# Patient Record
Sex: Female | Born: 1945
Health system: Southern US, Community
[De-identification: ages and names within clinical notes are randomized; demographics above are authoritative.]

## PROBLEM LIST (undated history)

## (undated) DIAGNOSIS — R32 Unspecified urinary incontinence: Secondary | ICD-10-CM

## (undated) DIAGNOSIS — F32A Depression, unspecified: Secondary | ICD-10-CM

## (undated) DIAGNOSIS — K602 Anal fissure, unspecified: Secondary | ICD-10-CM

## (undated) DIAGNOSIS — T7840XA Allergy, unspecified, initial encounter: Secondary | ICD-10-CM

## (undated) DIAGNOSIS — Z86718 Personal history of other venous thrombosis and embolism: Secondary | ICD-10-CM

## (undated) DIAGNOSIS — E119 Type 2 diabetes mellitus without complications: Secondary | ICD-10-CM

## (undated) DIAGNOSIS — I10 Essential (primary) hypertension: Secondary | ICD-10-CM

## (undated) DIAGNOSIS — H409 Unspecified glaucoma: Secondary | ICD-10-CM

## (undated) HISTORY — DX: Personal history of other venous thrombosis and embolism: Z86.718

## (undated) HISTORY — DX: Type 2 diabetes mellitus without complications: E11.9

## (undated) HISTORY — DX: Essential (primary) hypertension: I10

## (undated) HISTORY — PX: ABDOMINAL HYSTERECTOMY: SHX81

## (undated) HISTORY — PX: ROTATOR CUFF REPAIR: SHX139

## (undated) HISTORY — PX: CHOLECYSTECTOMY: SHX55

## (undated) HISTORY — DX: Unspecified urinary incontinence: R32

## (undated) HISTORY — DX: Depression, unspecified: F32.A

## (undated) HISTORY — PX: COLONOSCOPY: SHX174

## (undated) HISTORY — PX: ESOPHAGOGASTRODUODENOSCOPY: SHX1529

## (undated) HISTORY — DX: Allergy, unspecified, initial encounter: T78.40XA

## (undated) HISTORY — DX: Unspecified glaucoma: H40.9

## (undated) HISTORY — DX: Anal fissure, unspecified: K60.2

---

## 1979-07-04 HISTORY — PX: BREAST BIOPSY: SHX20

## 2003-07-04 HISTORY — PX: WRIST SURGERY: SHX841

## 2003-07-04 HISTORY — PX: ANKLE FRACTURE SURGERY: SHX122

## 2019-12-15 HISTORY — PX: ROTATOR CUFF REPAIR: SHX139

## 2021-02-10 LAB — COMPREHENSIVE METABOLIC PANEL
Albumin: 4.1 (ref 3.5–5.0)
Calcium: 9.6 (ref 8.7–10.7)
Globulin: 3

## 2021-02-10 LAB — CBC AND DIFFERENTIAL
HCT: 35 — AB (ref 36–46)
Hemoglobin: 11.4 — AB (ref 12.0–16.0)
Platelets: 379 (ref 150–399)
WBC: 8

## 2021-02-10 LAB — BASIC METABOLIC PANEL
BUN: 25 — AB (ref 4–21)
CO2: 24 — AB (ref 13–22)
Chloride: 99 (ref 99–108)
Creatinine: 1 (ref 0.5–1.1)
Glucose: 86
Potassium: 4.2 (ref 3.4–5.3)
Sodium: 134 — AB (ref 137–147)

## 2021-02-10 LAB — CBC: RBC: 3.77 — AB (ref 3.87–5.11)

## 2021-02-10 LAB — HEPATIC FUNCTION PANEL
ALT: 43 — AB (ref 7–35)
AST: 17 (ref 13–35)
Alkaline Phosphatase: 140 — AB (ref 25–125)
Bilirubin, Total: 0.7

## 2021-03-24 LAB — HEPATIC FUNCTION PANEL
ALT: 14 (ref 7–35)
AST: 11 — AB (ref 13–35)
Alkaline Phosphatase: 66 (ref 25–125)
Bilirubin, Total: 0.4

## 2021-03-24 LAB — BASIC METABOLIC PANEL
BUN: 17 (ref 4–21)
CO2: 23 — AB (ref 13–22)
Chloride: 102 (ref 99–108)
Creatinine: 0.7 (ref 0.5–1.1)
Glucose: 89
Potassium: 4.5 (ref 3.4–5.3)
Sodium: 139 (ref 137–147)

## 2021-03-24 LAB — RHEUMATOID FACTOR: Rheumatoid Factor (IgA): <10

## 2021-03-24 LAB — QUANTIFERON-TB GOLD PLUS: QUANTIFERON TB GOLD: NEGATIVE

## 2021-03-24 LAB — CBC AND DIFFERENTIAL
HCT: 36 (ref 36–46)
Hemoglobin: 11.4 — AB (ref 12.0–16.0)
Platelets: 396 (ref 150–399)
WBC: 5.6

## 2021-03-24 LAB — COMPREHENSIVE METABOLIC PANEL
Albumin: 4.2 (ref 3.5–5.0)
Calcium: 9.2 (ref 8.7–10.7)
GFR calc non Af Amer: 84
Globulin: 2.5

## 2021-03-24 LAB — POCT ERYTHROCYTE SEDIMENTATION RATE, NON-AUTOMATED: Sed Rate: 12

## 2021-03-24 LAB — ANA W/REFLEX: ANA Direct: NEGATIVE

## 2021-03-24 LAB — CBC: RBC: 3.86 — AB (ref 3.87–5.11)

## 2021-03-24 LAB — URIC ACID: Uric Acid: 3.2

## 2021-03-24 LAB — HM HEPATITIS C SCREENING LAB: HM Hepatitis Screen: NEGATIVE

## 2021-04-28 LAB — HM DIABETES EYE EXAM

## 2021-07-08 ENCOUNTER — Encounter: Payer: Self-pay | Admitting: Nurse Practitioner

## 2021-07-08 ENCOUNTER — Other Ambulatory Visit: Payer: Self-pay

## 2021-07-08 ENCOUNTER — Ambulatory Visit (INDEPENDENT_AMBULATORY_CARE_PROVIDER_SITE_OTHER): Payer: Medicare HMO | Admitting: Nurse Practitioner

## 2021-07-08 VITALS — BP 118/66 | HR 73 | Temp 97.6°F | Resp 14 | Ht 68.0 in | Wt 160.4 lb

## 2021-07-08 DIAGNOSIS — E039 Hypothyroidism, unspecified: Secondary | ICD-10-CM | POA: Diagnosis not present

## 2021-07-08 DIAGNOSIS — L858 Other specified epidermal thickening: Secondary | ICD-10-CM

## 2021-07-08 DIAGNOSIS — F341 Dysthymic disorder: Secondary | ICD-10-CM

## 2021-07-08 DIAGNOSIS — G8929 Other chronic pain: Secondary | ICD-10-CM

## 2021-07-08 DIAGNOSIS — M25561 Pain in right knee: Secondary | ICD-10-CM | POA: Diagnosis not present

## 2021-07-08 DIAGNOSIS — R197 Diarrhea, unspecified: Secondary | ICD-10-CM | POA: Diagnosis not present

## 2021-07-08 DIAGNOSIS — L309 Dermatitis, unspecified: Secondary | ICD-10-CM | POA: Diagnosis not present

## 2021-07-08 DIAGNOSIS — I10 Essential (primary) hypertension: Secondary | ICD-10-CM | POA: Diagnosis not present

## 2021-07-08 DIAGNOSIS — E118 Type 2 diabetes mellitus with unspecified complications: Secondary | ICD-10-CM | POA: Diagnosis not present

## 2021-07-08 LAB — COMPREHENSIVE METABOLIC PANEL
ALT: 10 U/L (ref 0–35)
AST: 12 U/L (ref 0–37)
Albumin: 4 g/dL (ref 3.5–5.2)
Alkaline Phosphatase: 46 U/L (ref 39–117)
BUN: 17 mg/dL (ref 6–23)
CO2: 27 mEq/L (ref 19–32)
Calcium: 9.2 mg/dL (ref 8.4–10.5)
Chloride: 103 mEq/L (ref 96–112)
Creatinine, Ser: 0.76 mg/dL (ref 0.40–1.20)
GFR: 76.31 mL/min (ref >60.00)
Glucose, Bld: 85 mg/dL (ref 70–99)
Potassium: 4.4 mEq/L (ref 3.5–5.1)
Sodium: 139 mEq/L (ref 135–145)
Total Bilirubin: 0.4 mg/dL (ref 0.2–1.2)
Total Protein: 6.6 g/dL (ref 6.0–8.3)

## 2021-07-08 LAB — CBC
HCT: 32.7 % — ABNORMAL LOW (ref 36.0–46.0)
Hemoglobin: 10.8 g/dL — ABNORMAL LOW (ref 12.0–15.0)
MCHC: 33 g/dL (ref 30.0–36.0)
MCV: 92 fl (ref 78.0–100.0)
Platelets: 323 10*3/uL (ref 150.0–400.0)
RBC: 3.55 Mil/uL — ABNORMAL LOW (ref 3.87–5.11)
RDW: 13.3 % (ref 11.5–15.5)
WBC: 7 10*3/uL (ref 4.0–10.5)

## 2021-07-08 LAB — POCT GLYCOSYLATED HEMOGLOBIN (HGB A1C): Hemoglobin A1C: 5.8 % — AB (ref 4.0–5.6)

## 2021-07-08 LAB — TSH: TSH: 2.34 u[IU]/mL (ref 0.35–5.50)

## 2021-07-08 NOTE — Assessment & Plan Note (Signed)
Seems to be cutaneous horn to right posterior distal lateral forearm.  Does have a history of skin cancer we will refer her to dermatology for further evaluation.

## 2021-07-08 NOTE — Assessment & Plan Note (Signed)
History of eczema that is being controlled with Dupixent.  Continue Dupixent as prescribed

## 2021-07-08 NOTE — Assessment & Plan Note (Signed)
Patient is currently maintained on metformin 3000 mg total daily.  POC A1c in office 5.8%.  Did discuss with patient and recommended top dose of metformin was 2000 mg daily she acknowledged.  We will change her to metformin 1000 mg twice daily.

## 2021-07-08 NOTE — Assessment & Plan Note (Signed)
Patient is maintained on citalopram 40 mg daily.  PHQ-9 and GAD-7 administered in office.  No HI/SI/AVH.  Continue citalopram 40 mg

## 2021-07-08 NOTE — Progress Notes (Signed)
New Patient Office Visit  Subjective:  Patient ID: Deanna Ramirez, female    DOB: [birth year]  Age: 76 y.o. MRN: [id number]  CC:  Chief Complaint  Patient presents with   Establish Care    See scanned page with previous providers patient seen in [place] and [place]   Encopresis    X 1 year. Daily issue with bowels, loosing control. Did not get to complete Endo and Colonoscopy done before leaving [place]    Skin Problem    Has a few white spots on her body and some were removed before and were pre cancerous.   Discuss possibility of having EPI    HPI Deanna Ramirez presents for Establish  DM: Does not take blood sugar test. If she feels weird she will check it. Metformin 100mg  tab 2 in the am and 1 pm. Didi verify that she has been taking 3,000mg  of metformin daily for approx 10 years poer patient report.  Depression: citalopram 40mg  daily PHQ 9 administered. No HI/Si/AVH  Hypothyroid: levothyroxine in the am.   HTN: Does not check bp at home. On losartan and amlodipine.  Excema: Dupixent 1 shot every 2 weeks  Had a dermatologist for hx of skin cancer.   Was set up with GI doctor due to having recent diarrhea with fecal incontinence at times  Has been seen in the past by urology for leaking. States she was told that if she wanted to have injections in the urethra that would help. She declined that offer.  Also set up with Ortho in the past due to joint effusions of the left knee and "almost bone on bone". She would like to avoid a joint replacement for as long as she can.  Dr Wynetta Emery allergist and does the dupixent  Past Medical History:  Diagnosis Date   Allergy    Depression    Diabetes mellitus without complication ([place])    Glaucoma    Hypertension    Urine incontinence       Family History  Problem Relation Age of Onset   Diabetes Mother    Depression Mother    Asthma Mother    Skin cancer Mother    Hearing loss Father    Diabetes Father    Hyperlipidemia  Brother    Diabetes Brother    Asthma Brother    Dementia Brother    Heart attack Brother    Depression Daughter    COPD Daughter    Asthma Daughter    Depression Daughter    Depression Son    Arthritis Son     Social History   Socioeconomic History   Marital status: Single    Spouse name: Not on file   Number of children: Not on file   Years of education: Not on file   Highest education level: Not on file  Occupational History   Not on file  Tobacco Use   Smoking status: Former    Packs/day: 2.00    Years: 35.00    Pack years: 70.00    Types: Cigarettes    Quit date: 1997    Years since quitting: 26.0   Smokeless tobacco: Never  Vaping Use   Vaping Use: Never used  Substance and Sexual Activity   Alcohol use: Yes    Comment: maybe twice a month   Drug use: Not Currently   Sexual activity: Not Currently  Other Topics Concern   Not on file  Social History Narrative   Not on file  Social Determinants of Health   Financial Resource Strain: Not on file  Food Insecurity: Not on file  Transportation Needs: Not on file  Physical Activity: Not on file  Stress: Not on file  Social Connections: Not on file  Intimate Partner Violence: Not on file    ROS Review of Systems  Constitutional:  Positive for fatigue. Negative for chills and fever.  Respiratory:  Negative for cough and shortness of breath.   Cardiovascular:  Negative for chest pain and leg swelling.  Gastrointestinal:  Positive for diarrhea. Negative for abdominal pain, blood in stool, nausea and vomiting.  Musculoskeletal:  Positive for arthralgias and joint swelling.  Neurological:  Positive for dizziness and numbness (rihgt). Negative for light-headedness and headaches.  Psychiatric/Behavioral:  Negative for hallucinations and sleep disturbance.    PHQ9 SCORE ONLY 07/08/2021  PHQ-9 Total Score 7    GAD 7 : Generalized Anxiety Score 07/08/2021  Nervous, Anxious, on Edge 0  Control/stop worrying 1   Worry too much - different things 1  Trouble relaxing 0  Restless 0  Easily annoyed or irritable 0  Afraid - awful might happen 0  Total GAD 7 Score 2  Anxiety Difficulty Not difficult at all      Objective:   Today's Vitals: BP 118/66    Pulse 73    Temp 97.6 F (36.4 C)    Resp 14    Ht 5\' 8"  (1.727 m)    Wt 160 lb 6 oz (72.7 kg)    SpO2 96%    BMI 24.38 kg/m   Physical Exam Vitals and nursing note reviewed.  Constitutional:      Appearance: Normal appearance.  HENT:     Right Ear: Tympanic membrane, ear canal and external ear normal.     Left Ear: Tympanic membrane, ear canal and external ear normal.     Mouth/Throat:     Mouth: Mucous membranes are moist.     Pharynx: Oropharynx is clear.  Eyes:     Extraocular Movements: Extraocular movements intact.     Pupils: Pupils are equal, round, and reactive to light.  Neck:     Thyroid: No thyroid mass, thyromegaly or thyroid tenderness.  Cardiovascular:     Rate and Rhythm: Normal rate and regular rhythm.     Pulses: Normal pulses.          Dorsalis pedis pulses are 2+ on the right side and 2+ on the left side.     Heart sounds: Normal heart sounds.  Pulmonary:     Effort: Pulmonary effort is normal.     Breath sounds: Normal breath sounds.  Abdominal:     General: Bowel sounds are normal. There is no distension.     Tenderness: There is no abdominal tenderness.  Musculoskeletal:     Comments: Limited abduction to left shoulder from previous shoulder surgeries.  Left wrist does not have flexion or extension due to having surgery.  She is missing her second toe on the right foot.   Feet:     Right foot:     Skin integrity: Dry skin present.     Left foot:     Skin integrity: Dry skin present.     Comments: Decreased sensation on the fore foot of right foot plantar surface.  Lymphadenopathy:     Cervical: No cervical adenopathy.  Skin:    General: Skin is warm and dry.  Neurological:     Mental Status: She is  alert.  Deep Tendon Reflexes:     Reflex Scores:      Bicep reflexes are 2+ on the right side and 2+ on the left side.      Patellar reflexes are 2+ on the right side and 2+ on the left side.    Comments: Bilateral upper and lower extremity strength 5/5.  Psychiatric:        Mood and Affect: Mood normal.        Behavior: Behavior normal.        Thought Content: Thought content normal.        Judgment: Judgment normal.    Assessment & Plan:   Problem List Items Addressed This Visit       Cardiovascular and Mediastinum   Primary hypertension    Patient currently maintained on losartan and amlodipine.  Does not check blood pressure at home.  We will check labs today in office pending results continue taking amlodipine as prescribed.      Relevant Medications   losartan (COZAAR) 100 MG tablet   amLODipine (NORVASC) 5 MG tablet   Other Relevant Orders   CBC (Completed)   Comprehensive metabolic panel (Completed)     Endocrine   Hypothyroidism   Relevant Medications   levothyroxine (SYNTHROID) 75 MCG tablet   Other Relevant Orders   TSH (Completed)   Controlled type 2 diabetes mellitus with complication, without long-term current use of insulin (HCC) - Primary   Relevant Medications   metFORMIN (GLUCOPHAGE) 1000 MG tablet   losartan (COZAAR) 100 MG tablet   Other Relevant Orders   POCT glycosylated hemoglobin (Hb A1C) (Completed)     Musculoskeletal and Integument   Cutaneous horn   Relevant Orders   Ambulatory referral to Dermatology   Eczema     Other   Diarrhea   Relevant Orders   Ambulatory referral to Gastroenterology   Chronic pain of right knee   Relevant Orders   Ambulatory referral to Orthopedic Surgery   Persistent depressive disorder   Relevant Medications   citalopram (CELEXA) 40 MG tablet    Outpatient Encounter Medications as of 07/08/2021  Medication Sig   amLODipine (NORVASC) 5 MG tablet Take 5 mg by mouth daily.   Apoaequorin (PREVAGEN PO)  Take by mouth. 1 in the morning   Cholecalciferol (VITAMIN D3) 50 MCG (2000 UT) TABS Take by mouth. 1 every other day   citalopram (CELEXA) 40 MG tablet Take 40 mg by mouth daily.   Cyanocobalamin (VITAMIN B-12) 5000 MCG TBDP Take by mouth. 1 daily   diphenhydrAMINE-APAP, sleep, (TYLENOL PM EXTRA STRENGTH PO) Take by mouth. 1 at bedtime   Dupilumab (DUPIXENT [place]) Inject into the skin. 1 shot every 14 days   Ferrous Sulfate (IRON) 325 (65 Fe) MG TABS Take by mouth. 1 every other day   gentamicin (GARAMYCIN) 0.3 % ophthalmic solution gentamicin 0.3 % eye drops   latanoprost (XALATAN) 0.005 % ophthalmic solution SMARTSIG:In Eye(s)   levothyroxine (SYNTHROID) 75 MCG tablet Take 75 mcg by mouth daily before breakfast.   losartan (COZAAR) 100 MG tablet Take 100 mg by mouth daily.   melatonin 5 MG TABS Take 5 mg by mouth at bedtime.   metFORMIN (GLUCOPHAGE) 1000 MG tablet Take 1 tablet by mouth 2 (two) times daily.   Multiple Vitamin (MULTIVITAMIN) tablet Take 1 tablet by mouth daily.   Pancrelipase, Lip-Prot-Amyl, (ZENPEP) 5000-24000 units CPEP Take by mouth. 4 tablets in the evening   timolol (TIMOPTIC) 0.5 % ophthalmic solution SMARTSIG:In Eye(s)  No facility-administered encounter medications on file as of 07/08/2021.    Follow-up: Return in about 4 months (around 11/05/2021) for recheck.  This visit occurred during the SARS-CoV-2 public health emergency.  Safety protocols were in place, including screening questions prior to the visit, additional usage of staff PPE, and extensive cleaning of exam room while observing appropriate contact time as indicated for disinfecting solutions.    Romilda Garret, NP

## 2021-07-08 NOTE — Patient Instructions (Signed)
Nice to see you today A1C was 5.8 mg today I am going to change your metformin to 1000mg  once in the morning and 1000mg  once in the evening.  I will be in touch with your lab work.  I will see you in approx 4 months, sooner if you need me

## 2021-07-08 NOTE — Assessment & Plan Note (Signed)
History of chronic right knee pain and joint effusion.  Has had fluid lifting many times per patient.  Was established with orthopedist like to reestablish.  Referral for orthopedist placed

## 2021-07-08 NOTE — Assessment & Plan Note (Signed)
Having persistent diarrhea was hospitalized back in August when she was in [place].  They did put her on Zenpep to help with the digestive enzymes.  States this is helped some but still having was can undergo colonoscopy but moved back to [place].  We will refer her to GI for further evaluation pending labs.

## 2021-07-08 NOTE — Assessment & Plan Note (Signed)
Maintained on levothyroxine 75 mcg daily.  We will check TSH in office today continue levothyroxine 75 mcg as prescribed

## 2021-07-08 NOTE — Assessment & Plan Note (Signed)
Patient currently maintained on losartan and amlodipine.  Does not check blood pressure at home.  We will check labs today in office pending results continue taking amlodipine as prescribed.

## 2021-07-12 ENCOUNTER — Other Ambulatory Visit (HOSPITAL_BASED_OUTPATIENT_CLINIC_OR_DEPARTMENT_OTHER): Payer: Self-pay | Admitting: Orthopaedic Surgery

## 2021-07-12 ENCOUNTER — Ambulatory Visit (HOSPITAL_BASED_OUTPATIENT_CLINIC_OR_DEPARTMENT_OTHER)
Admission: RE | Admit: 2021-07-12 | Discharge: 2021-07-12 | Disposition: A | Discharge status: Home / Self Care | Payer: Medicare HMO | Source: Ambulatory Visit | Attending: Orthopaedic Surgery | Admitting: Orthopaedic Surgery

## 2021-07-12 ENCOUNTER — Ambulatory Visit (HOSPITAL_BASED_OUTPATIENT_CLINIC_OR_DEPARTMENT_OTHER): Payer: Medicare HMO | Admitting: Orthopaedic Surgery

## 2021-07-12 ENCOUNTER — Other Ambulatory Visit: Payer: Self-pay

## 2021-07-12 DIAGNOSIS — G8929 Other chronic pain: Secondary | ICD-10-CM

## 2021-07-12 DIAGNOSIS — M1711 Unilateral primary osteoarthritis, right knee: Secondary | ICD-10-CM | POA: Diagnosis not present

## 2021-07-12 DIAGNOSIS — M25561 Pain in right knee: Secondary | ICD-10-CM | POA: Insufficient documentation

## 2021-07-12 NOTE — Progress Notes (Signed)
Chief Complaint: Right knee pain     History of Present Illness:    Deanna Ramirez is a 76 y.o. female presents with right knee pain that has been going on for multiple years.  She does note that she has significant tricompartmental osteoarthritis.  She has been avoiding knee arthroplasty as she has a history of multiple procedures that resulted in infection including a left tibia fracture repair as well as a left shoulder arthroscopy.  Both times these required subsequent procedures as result she has had trepidation about having the surgery for the right knee.  She does have a history of right knee ACL reconstruction done in 2005 on this knee.  Since that time she has had pain and feels like it gives out.  She states that periodically the knee will become very swollen at which time it is historically been aspirated and injected with steroids.  This does help significantly for up to 6 months.  This was last done in June 2022 in [place] where she is from.  She is recently moved to [place] to be near her daughter.    Surgical History:   Right knee ACL reconstruction 2005  PMH/PSH/Family History/Social History/Meds/Allergies:    Past Medical History:  Diagnosis Date   Allergy    Depression    Diabetes mellitus without complication ([place])    Glaucoma    Hypertension    Urine incontinence     Social History   Socioeconomic History   Marital status: Single    Spouse name: Not on file   Number of children: Not on file   Years of education: Not on file   Highest education level: Not on file  Occupational History   Not on file  Tobacco Use   Smoking status: Former    Packs/day: 2.00    Years: 35.00    Pack years: 70.00    Types: Cigarettes    Quit date: 43    Years since quitting: 26.0   Smokeless tobacco: Never  Vaping Use   Vaping Use: Never used  Substance and Sexual Activity   Alcohol use: Yes    Comment: maybe twice a month   Drug  use: Not Currently   Sexual activity: Not Currently  Other Topics Concern   Not on file  Social History Narrative   Not on file   Social Determinants of Health   Financial Resource Strain: Not on file  Food Insecurity: Not on file  Transportation Needs: Not on file  Physical Activity: Not on file  Stress: Not on file  Social Connections: Not on file   Family History  Problem Relation Age of Onset   Diabetes Mother    Depression Mother    Asthma Mother    Skin cancer Mother    Hearing loss Father    Diabetes Father    Hyperlipidemia Brother    Diabetes Brother    Asthma Brother    Dementia Brother    Heart attack Brother    Depression Daughter    COPD Daughter    Asthma Daughter    Depression Daughter    Depression Son    Arthritis Son    Allergies  Allergen Reactions   Balsam Itching    BALSAM OF [place]   Clobetasone Itching   Hydrocortisone Butyrate  Latex     itching   Neomycin Sulfate [Neomycin] Itching   Nickel Itching   Other     HYDROCORTISONE AND TIXOCORTOL TYPE-itching   Potassium Dichromate Itching    CHROMIUM   Thimerosal Itching    Reacted after using these drops, ended up in ICU   Thiourea Itching   Wool Alcohol [Lanolin] Itching   Current Outpatient Medications  Medication Sig Dispense Refill   amLODipine (NORVASC) 5 MG tablet Take 5 mg by mouth daily.     Apoaequorin (PREVAGEN PO) Take by mouth. 1 in the morning     Cholecalciferol (VITAMIN D3) 50 MCG (2000 UT) TABS Take by mouth. 1 every other day     citalopram (CELEXA) 40 MG tablet Take 40 mg by mouth daily.     Cyanocobalamin (VITAMIN B-12) 5000 MCG TBDP Take by mouth. 1 daily     diphenhydrAMINE-APAP, sleep, (TYLENOL PM EXTRA STRENGTH PO) Take by mouth. 1 at bedtime     Dupilumab (DUPIXENT [place]) Inject into the skin. 1 shot every 14 days     Ferrous Sulfate (IRON) 325 (65 Fe) MG TABS Take by mouth. 1 every other day     gentamicin (GARAMYCIN) 0.3 % ophthalmic solution gentamicin 0.3 %  eye drops     latanoprost (XALATAN) 0.005 % ophthalmic solution SMARTSIG:In Eye(s)     levothyroxine (SYNTHROID) 75 MCG tablet Take 75 mcg by mouth daily before breakfast.     losartan (COZAAR) 100 MG tablet Take 100 mg by mouth daily.     melatonin 5 MG TABS Take 5 mg by mouth at bedtime.     metFORMIN (GLUCOPHAGE) 1000 MG tablet Take 1 tablet by mouth 2 (two) times daily.     Multiple Vitamin (MULTIVITAMIN) tablet Take 1 tablet by mouth daily.     Pancrelipase, Lip-Prot-Amyl, (ZENPEP) 5000-24000 units CPEP Take by mouth. 4 tablets in the evening     timolol (TIMOPTIC) 0.5 % ophthalmic solution SMARTSIG:In Eye(s)     No current facility-administered medications for this visit.   No results found.  Review of Systems:   A ROS was performed including pertinent positives and negatives as documented in the HPI.  Physical Exam :   Constitutional: NAD and appears stated age Neurological: Alert and oriented Psych: Appropriate affect and cooperative There were no vitals taken for this visit.   Comprehensive Musculoskeletal Exam:      Musculoskeletal Exam  Gait Normal  Alignment Normal   Right Left  Inspection Normal Normal  Palpation    Tenderness Tricompartmental Mild  Crepitus Moderate None  Effusion Trace Mild  Range of Motion    Extension 0 0  Flexion 135 135  Strength    Extension 5/5 5/5  Flexion 5/5 5/5  Ligament Exam     Generalized Laxity No No  Lachman Negative Negative   Pivot Shift Negative Negative  Anterior Drawer Negative Negative  Valgus at 0 Negative Negative  Valgus at 20 Negative Negative  Varus at 0 0 0  Varus at 20   0 0  Posterior Drawer at 90 0 0  Vascular/Lymphatic Exam    Edema None None  Venous Stasis Changes No No  Distal Circulation Normal Normal  Neurologic    Light Touch Sensation Intact Intact  Special Tests:      Imaging:   Xray (4 views right knee): Tricompartmental osteoarthritis which is severe    I personally reviewed and  interpreted the radiographs.   Assessment:   76 year old female with severe  tricompartmental osteoarthritis here to establish care as she periodically requires injections for the knee.  Described that I would be happy to perform these as needed.  Plan :    -Follow-up as needed     I personally saw and evaluated the patient, and participated in the management and treatment plan.  Vanetta Mulders, MD Attending Physician, Orthopedic Surgery  This document was dictated using Dragon voice recognition software. A reasonable attempt at proof reading has been made to minimize errors.

## 2021-07-21 ENCOUNTER — Encounter: Payer: Self-pay | Admitting: Nurse Practitioner

## 2021-07-29 DIAGNOSIS — Z961 Presence of intraocular lens: Secondary | ICD-10-CM | POA: Diagnosis not present

## 2021-07-29 DIAGNOSIS — H04123 Dry eye syndrome of bilateral lacrimal glands: Secondary | ICD-10-CM | POA: Diagnosis not present

## 2021-07-29 DIAGNOSIS — H401113 Primary open-angle glaucoma, right eye, severe stage: Secondary | ICD-10-CM | POA: Diagnosis not present

## 2021-07-29 DIAGNOSIS — H524 Presbyopia: Secondary | ICD-10-CM | POA: Diagnosis not present

## 2021-08-02 ENCOUNTER — Encounter: Payer: Self-pay | Admitting: Internal Medicine

## 2021-08-03 DIAGNOSIS — L570 Actinic keratosis: Secondary | ICD-10-CM | POA: Diagnosis not present

## 2021-08-03 DIAGNOSIS — D225 Melanocytic nevi of trunk: Secondary | ICD-10-CM | POA: Diagnosis not present

## 2021-08-03 DIAGNOSIS — Z1283 Encounter for screening for malignant neoplasm of skin: Secondary | ICD-10-CM | POA: Diagnosis not present

## 2021-08-03 DIAGNOSIS — X32XXXA Exposure to sunlight, initial encounter: Secondary | ICD-10-CM | POA: Diagnosis not present

## 2021-08-31 ENCOUNTER — Encounter: Payer: Self-pay | Admitting: Internal Medicine

## 2021-08-31 ENCOUNTER — Telehealth: Payer: Self-pay | Admitting: Internal Medicine

## 2021-08-31 ENCOUNTER — Other Ambulatory Visit: Payer: Self-pay

## 2021-08-31 ENCOUNTER — Ambulatory Visit (INDEPENDENT_AMBULATORY_CARE_PROVIDER_SITE_OTHER)
Admission: RE | Admit: 2021-08-31 | Discharge: 2021-08-31 | Disposition: A | Discharge status: Home / Self Care | Payer: Medicare HMO | Source: Ambulatory Visit | Attending: Internal Medicine | Admitting: Internal Medicine

## 2021-08-31 ENCOUNTER — Ambulatory Visit: Payer: Medicare HMO | Admitting: Internal Medicine

## 2021-08-31 ENCOUNTER — Other Ambulatory Visit (INDEPENDENT_AMBULATORY_CARE_PROVIDER_SITE_OTHER): Payer: Medicare HMO

## 2021-08-31 VITALS — BP 110/70 | HR 76 | Ht 68.0 in | Wt 167.0 lb

## 2021-08-31 DIAGNOSIS — R197 Diarrhea, unspecified: Secondary | ICD-10-CM

## 2021-08-31 DIAGNOSIS — Z9049 Acquired absence of other specified parts of digestive tract: Secondary | ICD-10-CM | POA: Diagnosis not present

## 2021-08-31 DIAGNOSIS — D649 Anemia, unspecified: Secondary | ICD-10-CM

## 2021-08-31 DIAGNOSIS — R159 Full incontinence of feces: Secondary | ICD-10-CM

## 2021-08-31 DIAGNOSIS — K5900 Constipation, unspecified: Secondary | ICD-10-CM | POA: Diagnosis not present

## 2021-08-31 LAB — IBC + FERRITIN
Ferritin: 29.2 ng/mL (ref 10.0–291.0)
Iron: 59 ug/dL (ref 42–145)
Saturation Ratios: 16.8 % — ABNORMAL LOW (ref 20.0–50.0)
TIBC: 351.4 ug/dL (ref 250.0–450.0)
Transferrin: 251 mg/dL (ref 212.0–360.0)

## 2021-08-31 LAB — RETICULOCYTES
ABS Retic: 44040 cells/uL (ref 20000–80000)
Retic Ct Pct: 1.2 %

## 2021-08-31 LAB — B12 AND FOLATE PANEL
Folate: >24.2 ng/mL (ref >5.9)
Vitamin B-12: 299 pg/mL (ref 211–911)

## 2021-08-31 MED ORDER — IRON (FERROUS SULFATE) 325 (65 FE) MG PO TABS: 325.0000 mg | ORAL_TABLET | Freq: Every day | ORAL | Status: AC

## 2021-08-31 NOTE — Addendum Note (Signed)
Addended by: Hardie Pulley, Story Conti J on: 08/31/2021 01:41 PM ? ? Modules accepted: Orders ? ?

## 2021-08-31 NOTE — Telephone Encounter (Signed)
Xray department called to get the order changed to 2 views versus 1 because the order is for 1 view but they are notes indicating 2. ?

## 2021-08-31 NOTE — Telephone Encounter (Signed)
Order was already changed. ?

## 2021-08-31 NOTE — Progress Notes (Signed)
Chief Complaint: Diarrhea  HPI : 76 year old female with history of DM, DVT/PE, anal fissure, urinary incontinence presents with diarrhea  She has been having diarrhea and fecal incontinence for the last 1-2 years. She thought that it was because she lost her fiance in 10/2019. She started drinking alcohol after her fiance died. She has since stopped drinking alcohol substantially (now drinks twice per month). She will have fecal incontinence and not even realize that she had fecal incontinence. She also deals with urinary incontinence. Had 3 kids in the past via vaginal deliveries. When she has diarrhea, she has mucous-like stools and stools rush out of her when she sits down to use the bathroom. She will on average have 4 BMs a day. She used to have only 4 BMs per week prior to the change in BMs that she experienced 1-2 years ago. At its worst, she would have 15 episodes of fecal incontinence per day. She is currently living with her daughter in [place]. Denies blood in stools. Denies ab pain. She lost 15 lbs last year, and has now gained 7 lbs of that back. She gained weight after she was started on Zenpep at night. Denies fam hx of colon cancer. She has had colon polyps in the past. Denies prior pancreas issues. Denies dysphagia or N&V. Endorses oily-looking stools.    Wt Readings from Last 3 Encounters:  08/31/21 167 lb (75.8 kg)  07/08/21 160 lb 6 oz (72.7 kg)   Past Medical History:  Diagnosis Date   Allergy    Anal fissure    Depression    Diabetes mellitus without complication (HCC)    Glaucoma    History of DVT of lower extremity    also had a PE   Hypertension    Urine incontinence      Past Surgical History:  Procedure Laterality Date   ABDOMINAL HYSTERECTOMY     total   ANKLE FRACTURE SURGERY Left 2005   multi compound fracture, 21 surgeries total   BREAST BIOPSY Left 1981   benign   CHOLECYSTECTOMY     COLONOSCOPY     ESOPHAGOGASTRODUODENOSCOPY     ROTATOR CUFF REPAIR  Left 12/15/2019   rotator cuff and torn bysep T tendon   ROTATOR CUFF REPAIR Left    again on 12/30/19 to remove screws taht were put in earlier in June due to infection   WRIST SURGERY Left 2005   rod in it, 4 surgeries   Family History  Problem Relation Age of Onset   Diabetes Mother    Depression Mother    Asthma Mother    Skin cancer Mother    Hearing loss Father    Diabetes Father    Hyperlipidemia Brother    Diabetes Brother    Asthma Brother    Dementia Brother    Heart attack Brother    Depression Daughter    COPD Daughter    Asthma Daughter    Depression Daughter    Depression Son    Arthritis Son    Social History   Tobacco Use   Smoking status: Former    Packs/day: 2.00    Years: 35.00    Pack years: 70.00    Types: Cigarettes    Quit date: 1997    Years since quitting: 26.1   Smokeless tobacco: Never  Vaping Use   Vaping Use: Never used  Substance Use Topics   Alcohol use: Yes    Comment: maybe twice a month  Drug use: Not Currently   Current Outpatient Medications  Medication Sig Dispense Refill   amLODipine (NORVASC) 5 MG tablet Take 5 mg by mouth daily.     Apoaequorin (PREVAGEN PO) Take by mouth. 1 in the morning     Cholecalciferol (VITAMIN D3) 50 MCG (2000 UT) TABS Take by mouth. 1 every other day     citalopram (CELEXA) 40 MG tablet Take 40 mg by mouth daily.     Cyanocobalamin (VITAMIN B-12) 5000 MCG TBDP Take by mouth. 1 daily     diphenhydrAMINE-APAP, sleep, (TYLENOL PM EXTRA STRENGTH PO) Take by mouth. 1 at bedtime     Dupilumab (DUPIXENT [place]) Inject into the skin. 1 shot every 14 days     Ferrous Sulfate (IRON) 325 (65 Fe) MG TABS Take by mouth. 1 every other day     gentamicin (GARAMYCIN) 0.3 % ophthalmic solution gentamicin 0.3 % eye drops     latanoprost (XALATAN) 0.005 % ophthalmic solution SMARTSIG:In Eye(s)     levothyroxine (SYNTHROID) 75 MCG tablet Take 75 mcg by mouth daily before breakfast.     losartan (COZAAR) 100 MG tablet  Take 100 mg by mouth daily.     melatonin 5 MG TABS Take 5 mg by mouth at bedtime.     metFORMIN (GLUCOPHAGE) 1000 MG tablet Take 1 tablet by mouth 2 (two) times daily.     Multiple Vitamin (MULTIVITAMIN) tablet Take 1 tablet by mouth daily.     Pancrelipase, Lip-Prot-Amyl, (ZENPEP) 5000-24000 units CPEP Take by mouth. 4 tablets in the evening     timolol (TIMOPTIC) 0.5 % ophthalmic solution SMARTSIG:In Eye(s)     No current facility-administered medications for this visit.   Allergies  Allergen Reactions   Balsam Itching    BALSAM OF [place]   Clobetasone Itching   Hydrocortisone Butyrate    Latex     itching   Neomycin Sulfate [Neomycin] Itching   Nickel Itching   Other     HYDROCORTISONE AND TIXOCORTOL TYPE-itching   Potassium Dichromate Itching    CHROMIUM   Thimerosal Itching    Reacted after using these drops, ended up in ICU   Thiourea Itching   Wool Alcohol [Lanolin] Itching     Review of Systems: All systems reviewed and negative except where noted in HPI.   Physical Exam: BP 110/70    Pulse 76    Ht 5\' 8"  (1.727 m)    Wt 167 lb (75.8 kg)    BMI 25.39 kg/m  Constitutional: Pleasant,well-developed, female in no acute distress. HEENT: Normocephalic and atraumatic. Conjunctivae are normal. No scleral icterus. Cardiovascular: Normal rate, regular rhythm.  Pulmonary/chest: Effort normal and breath sounds normal. No wheezing, rales or rhonchi. Abdominal: Soft, nondistended, nontender. Bowel sounds active throughout. There are no masses palpable. No hepatomegaly. Extremities: No edema Neurological: Alert and oriented to person place and time. Skin: Skin is warm and dry. No rashes noted. Psychiatric: Normal mood and affect. Behavior is normal.  Labs 07/2021: TSH nml. CBC with low Hb of 10.8, MCV 92. CMP unremarkable.  CT A/P w/contrast 08/24/21:   EGD/colonoscopy 02/16/15: 2 cm sliding hiatal hernia with hiatus noted at 40 cm from the incisors. Normal stomach. Normal  duodenum with biopsies taken to rule out celiac disease. Colonoscopy showed diverticulosis in the left colon. Path: Benign duodenal mucosa  ASSESSMENT AND PLAN: Diarrhea Fecal incontinence Anemia Possible pancreatic insufficiency Patient presents with diarrhea and fecal incontinence over the last 1 to 2 years.  This could be  due to pancreatic insufficiency since she did have some improvement in her fecal incontinence after being started on Zenpep.  However, besides her substantial alcohol intake history, I do not see any confirmed documentation of pancreatic insufficiency.  Thus we will plan to check fecal elastase and rule out infectious gastroenteritis by getting stool tests.  We will also start her on fiber supplement to see if this helps with her fecal incontinence symptoms.  We will check a KUB to rule out overflow incontinence as a contributor.  I went over the risks and benefits of colonoscopy with the patient, and she would like to proceed with colonoscopy for further evaluation of her diarrhea.  If her fecal elastase is truly low, then she will need optimization of her dosing of the Zenpep medication.  Patient was noted to have anemia, which she notes has been seen for several years.  We will plan for basic anemia work-up.  - Start fiber supplement - Check ferritin, iron/TIBC, vitamin B12, folate, reticulocytes - Check fecal elastase, C dif, GI path panel, O&P - KUB - Colonoscopy [facility].  Patient states that she has had itching when taking polyethylene glycol in the past (of note patient does have history of eczema and described active issues with itching related to pollen during her visit today).  She denied any issues with her breathing when she took MiraLAX previously.  I told the patient to do a trial of MiraLAX before she underwent her prep to see if she had a recurrence and itching.  She will plan to take Benadryl prior to performing her MiraLAX prep.  Several alternative preps were tried  within our system but these preps all contain potassium dichromate, which is also listed as an allergy for the patient.  She does have an EpiPen at home if necessary.  [name], MD  I spent 61 minutes of time, including in depth chart review, independent review of results as outlined above, communicating results with the patient directly, face-to-face time with the patient, coordinating care, ordering studies and medications as appropriate, and documentation.

## 2021-08-31 NOTE — Addendum Note (Signed)
Addended by: Aleatha Borer on: 08/31/2021 01:31 PM ? ? Modules accepted: Orders ? ?

## 2021-08-31 NOTE — Progress Notes (Signed)
Hi [name], please let Deanna Ramirez know that her labs show that her anemia is due to iron deficiency. Please have her start a daily iron supplement with ferrous sulfate 325 mg QD. Also I would recommend adding on an EGD to her colonoscopy due to her iron deficiency anemia (to ensure that she is not losing blood through the gastrointestinal tract). If she is okay to add on an EGD, then okay to overbook her current time slot on 3/6 to make it an EGD/colonoscopy combined procedure. Thanks.

## 2021-08-31 NOTE — Patient Instructions (Addendum)
If you are age 77 or older, your body mass index should be between 23-30. Your Body mass index is 25.39 kg/m?Marland Kitchen If this is out of the aforementioned range listed, please consider follow up with your Primary Care Provider. ?________________________________________________________ ? ?The [facility] providers would like to encourage you to use [facility] to communicate with providers for non-urgent requests or questions.  Due to long hold times on the telephone, sending your provider a message by [facility] may be a faster and more efficient way to get a response.  Please allow 48 business hours for a response.  Please remember that this is for non-urgent requests.  ?_______________________________________________________ ? ?You have been scheduled for a colonoscopy. Please follow written instructions given to you at your visit today.  ?Please pick up your prep supplies at the pharmacy within the next 1-3 days. ?If you use inhalers (even only as needed), please bring them with you on the day of your procedure. ? ?Your provider has requested that you go to the basement level for lab work before leaving today. Press "B" on the elevator. The lab is located at the first door on the left as you exit the elevator. ? ?Try using Fiber. ? ?Thank you for entrusting me with your care and choosing [facility]. ? ?Dr Lorenso Courier ?

## 2021-09-01 ENCOUNTER — Other Ambulatory Visit: Payer: Self-pay

## 2021-09-01 ENCOUNTER — Other Ambulatory Visit: Payer: Medicare HMO

## 2021-09-01 DIAGNOSIS — R197 Diarrhea, unspecified: Secondary | ICD-10-CM

## 2021-09-01 DIAGNOSIS — R159 Full incontinence of feces: Secondary | ICD-10-CM | POA: Diagnosis not present

## 2021-09-01 DIAGNOSIS — D649 Anemia, unspecified: Secondary | ICD-10-CM | POA: Diagnosis not present

## 2021-09-01 DIAGNOSIS — Z23 Encounter for immunization: Secondary | ICD-10-CM

## 2021-09-01 NOTE — Progress Notes (Signed)
Opened encounter in error  

## 2021-09-02 LAB — CLOSTRIDIUM DIFFICILE BY PCR: Toxigenic C. Difficile by PCR: NEGATIVE

## 2021-09-03 LAB — GI PROFILE, STOOL, PCR

## 2021-09-05 ENCOUNTER — Encounter: Payer: Self-pay | Admitting: Internal Medicine

## 2021-09-05 ENCOUNTER — Ambulatory Visit (AMBULATORY_SURGERY_CENTER): Payer: Medicare HMO | Admitting: Internal Medicine

## 2021-09-05 VITALS — BP 147/66 | HR 56 | Temp 97.3°F | Resp 15 | Ht 68.0 in | Wt 167.0 lb

## 2021-09-05 DIAGNOSIS — K621 Rectal polyp: Secondary | ICD-10-CM | POA: Diagnosis not present

## 2021-09-05 DIAGNOSIS — K317 Polyp of stomach and duodenum: Secondary | ICD-10-CM | POA: Diagnosis not present

## 2021-09-05 DIAGNOSIS — K635 Polyp of colon: Secondary | ICD-10-CM | POA: Diagnosis not present

## 2021-09-05 DIAGNOSIS — K31A0 Gastric intestinal metaplasia, unspecified: Secondary | ICD-10-CM

## 2021-09-05 DIAGNOSIS — K2970 Gastritis, unspecified, without bleeding: Secondary | ICD-10-CM

## 2021-09-05 DIAGNOSIS — K2940 Chronic atrophic gastritis without bleeding: Secondary | ICD-10-CM | POA: Diagnosis not present

## 2021-09-05 DIAGNOSIS — D509 Iron deficiency anemia, unspecified: Secondary | ICD-10-CM | POA: Diagnosis not present

## 2021-09-05 DIAGNOSIS — R197 Diarrhea, unspecified: Secondary | ICD-10-CM | POA: Diagnosis not present

## 2021-09-05 DIAGNOSIS — R109 Unspecified abdominal pain: Secondary | ICD-10-CM | POA: Diagnosis not present

## 2021-09-05 DIAGNOSIS — D129 Benign neoplasm of anus and anal canal: Secondary | ICD-10-CM

## 2021-09-05 DIAGNOSIS — D649 Anemia, unspecified: Secondary | ICD-10-CM

## 2021-09-05 DIAGNOSIS — D120 Benign neoplasm of cecum: Secondary | ICD-10-CM

## 2021-09-05 MED ORDER — SODIUM CHLORIDE 0.9 % IV SOLN: 500.0000 mL | Freq: Once | INTRAVENOUS | Status: AC

## 2021-09-05 NOTE — Patient Instructions (Signed)
Thank you for letting us take care of your healthcare needs today. ?Please see handouts given to you on Polyps, Diverticulosis, Hemorrhoids and Gastritis. ? ? ? ?YOU HAD AN ENDOSCOPIC PROCEDURE TODAY AT [facility]:   Refer to the procedure report that was given to you for any specific questions about what was found during the examination.  If the procedure report does not answer your questions, please call your gastroenterologist to clarify.  If you requested that your care partner not be given the details of your procedure findings, then the procedure report has been included in a sealed envelope for you to review at your convenience later. ? ?YOU SHOULD EXPECT: Some feelings of bloating in the abdomen. Passage of more gas than usual.  Walking can help get rid of the air that was put into your GI tract during the procedure and reduce the bloating. If you had a lower endoscopy (such as a colonoscopy or flexible sigmoidoscopy) you may notice spotting of blood in your stool or on the toilet paper. If you underwent a bowel prep for your procedure, you may not have a normal bowel movement for a few days. ? ?Please Note:  You might notice some irritation and congestion in your nose or some drainage.  This is from the oxygen used during your procedure.  There is no need for concern and it should clear up in a day or so. ? ?SYMPTOMS TO REPORT IMMEDIATELY: ? ?Following lower endoscopy (colonoscopy or flexible sigmoidoscopy): ? Excessive amounts of blood in the stool ? Significant tenderness or worsening of abdominal pains ? Swelling of the abdomen that is new, acute ? Fever of 100?F or higher ? ?Following upper endoscopy (EGD) ? Vomiting of blood or coffee ground material ? New chest pain or pain under the shoulder blades ? Painful or persistently difficult swallowing ? New shortness of breath ? Fever of 100?F or higher ? Black, tarry-looking stools ? ?For urgent or emergent issues, a gastroenterologist  can be reached at any hour by calling [phone]. ?Do not use MyChart messaging for urgent concerns.  ? ? ?DIET:  We do recommend a small meal at first, but then you may proceed to your regular diet.  Drink plenty of fluids but you should avoid alcoholic beverages for 24 hours. ? ?ACTIVITY:  You should plan to take it easy for the rest of today and you should NOT DRIVE or use heavy machinery until tomorrow (because of the sedation medicines used during the test).   ? ?FOLLOW UP: ?Our staff will call the number listed on your records 48-72 hours following your procedure to check on you and address any questions or concerns that you may have regarding the information given to you following your procedure. If we do not reach you, we will leave a message.  We will attempt to reach you two times.  During this call, we will ask if you have developed any symptoms of COVID 19. If you develop any symptoms (ie: fever, flu-like symptoms, shortness of breath, cough etc.) before then, please call [phone].  If you test positive for Covid 19 in the 2 weeks post procedure, please call and report this information to [place].   ? ?If any biopsies were taken you will be contacted by phone or by letter within the next 1-3 weeks.  Please call us at [phone] if you have not heard about the biopsies in 3 weeks.  ? ? ?SIGNATURES/CONFIDENTIALITY: ?You and/or your care partner  have signed paperwork which will be entered into your electronic medical record.  These signatures attest to the fact that that the information above on your After Visit Summary has been reviewed and is understood.  Full responsibility of the confidentiality of this discharge information lies with you and/or your care-partner.  ?

## 2021-09-05 NOTE — Progress Notes (Signed)
Vss nad trans to pacu °

## 2021-09-05 NOTE — Op Note (Signed)
[place] ?Patient Name: Deanna Ramirez ?Procedure Date: 09/05/2021 3:25 PM ?MRN: [id number] ?Endoscopist: Sonny Masters "[name] ,  ?Age: 76 ?Referring MD:  ?Date of Birth: Aug 18, 1945 ?Gender: Female ?Account #: [id number] ?Procedure:                Upper GI endoscopy ?Indications:              Iron deficiency anemia ?Medicines:                Monitored Anesthesia Care ?Procedure:                Pre-Anesthesia Assessment: ?                          - Prior to the procedure, a History and Physical  ?                          was performed, and patient medications and  ?                          allergies were reviewed. The patient's tolerance of  ?                          previous anesthesia was also reviewed. The risks  ?                          and benefits of the procedure and the sedation  ?                          options and risks were discussed with the patient.  ?                          All questions were answered, and informed consent  ?                          was obtained. Prior Anticoagulants: The patient has  ?                          taken no previous anticoagulant or antiplatelet  ?                          agents. ASA Grade Assessment: II - A patient with  ?                          mild systemic disease. After reviewing the risks  ?                          and benefits, the patient was deemed in  ?                          satisfactory condition to undergo the procedure. ?                          After obtaining informed consent, the endoscope was  ?  passed under direct vision. Throughout the  ?                          procedure, the patient's blood pressure, pulse, and  ?                          oxygen saturations were monitored continuously. The  ?                          GIF [id number] #[id number] was introduced through the  ?                          mouth, and advanced to the second part of duodenum.  ?                          The upper GI endoscopy was  accomplished without  ?                          difficulty. The patient tolerated the procedure  ?                          well. ?Scope In: ?Scope Out: ?Findings:                 The examined esophagus was normal. Biopsies were  ?                          taken with a cold forceps for histology. ?                          Localized inflammation characterized by congestion  ?                          (edema) and erythema was found in the gastric  ?                          antrum. Biopsies were taken with a cold forceps for  ?                          histology. ?                          Two 3 to 5 mm sessile polyps with no bleeding and  ?                          no stigmata of recent bleeding were found in the  ?                          gastric body. The polyp was removed with a cold  ?                          biopsy forceps. Resection and retrieval were  ?  complete. ?                          The examined duodenum was normal. Biopsies were  ?                          taken with a cold forceps for histology. ?Complications:            No immediate complications. ?Estimated Blood Loss:     Estimated blood loss was minimal. ?Impression:               - Normal esophagus. Biopsied. ?                          - Gastritis. Biopsied. ?                          - Two gastric polyps. Resected and retrieved. ?                          - Normal examined duodenum. Biopsied. ?Recommendation:           - Await pathology results. ?                          - Perform a colonoscopy today. ?Georgian Co,  ?09/05/2021 4:26:10 PM ?

## 2021-09-05 NOTE — Progress Notes (Signed)
? ?GASTROENTEROLOGY PROCEDURE H&P NOTE  ? ?Primary Care Physician: ?Michela Pitcher, NP ? ? ? ?Reason for Procedure:   Diarrhea, anemia ? ?Plan:    EGD/colonoscopy ? ?Patient is appropriate for endoscopic procedure(s) in the ambulatory ([facility]) setting. ? ?The nature of the procedure, as well as the risks, benefits, and alternatives were carefully and thoroughly reviewed with the patient. Ample time for discussion and questions allowed. The patient understood, was satisfied, and agreed to proceed.  ? ? ? ?HPI: ?Deanna Ramirez is a 76 y.o. female who presents for EGD/colonoscopy to evaluate diarrhea and anemia. She states that she was able to tolerate the Miralax colon preparation without issues. She did not even have to take a Benadryl before the prep and did not experience any itching. She was last seen in clinic on 08/31/21.  ? ?Past Medical History:  ?Diagnosis Date  ? Allergy   ? Anal fissure   ? Depression   ? Diabetes mellitus without complication ([place])   ? Glaucoma   ? History of DVT of lower extremity   ? also had a PE  ? Hypertension   ? Urine incontinence   ? ? ?Past Surgical History:  ?Procedure Laterality Date  ? ABDOMINAL HYSTERECTOMY    ? total  ? ANKLE FRACTURE SURGERY Left 2005  ? multi compound fracture, 21 surgeries total  ? BREAST BIOPSY Left 1981  ? benign  ? CHOLECYSTECTOMY    ? COLONOSCOPY    ? ESOPHAGOGASTRODUODENOSCOPY    ? ROTATOR CUFF REPAIR Left 12/15/2019  ? rotator cuff and torn bysep T tendon  ? ROTATOR CUFF REPAIR Left   ? again on 12/30/19 to remove screws taht were put in earlier in June due to infection  ? WRIST SURGERY Left 2005  ? rod in it, 4 surgeries  ? ? ?Prior to Admission medications   ?Medication Sig Start Date End Date Taking? Authorizing Provider  ?amLODipine (NORVASC) 5 MG tablet Take 5 mg by mouth daily.   Yes [provider]  ?Apoaequorin (PREVAGEN PO) Take by mouth. 1 in the morning   Yes [provider]  ?Cholecalciferol (VITAMIN D3) 50 MCG (2000 UT) TABS  Take by mouth. 1 every other day   Yes [provider]  ?citalopram (CELEXA) 40 MG tablet Take 40 mg by mouth daily.   Yes [provider]  ?Cyanocobalamin (VITAMIN B-12) 5000 MCG TBDP Take by mouth. 1 daily   Yes [provider]  ?diphenhydrAMINE-APAP, sleep, (TYLENOL PM EXTRA STRENGTH PO) Take by mouth. 1 at bedtime   Yes [provider]  ?gentamicin (GARAMYCIN) 0.3 % ophthalmic solution gentamicin 0.3 % eye drops   Yes [provider]  ?Iron, Ferrous Sulfate, 325 (65 Fe) MG TABS Take 325 mg by mouth daily. 08/31/21  Yes Sharyn Creamer, MD  ?latanoprost (XALATAN) 0.005 % ophthalmic solution SMARTSIG:In Eye(s) 06/18/21  Yes [provider]  ?levothyroxine (SYNTHROID) 75 MCG tablet Take 75 mcg by mouth daily before breakfast.   Yes [provider]  ?losartan (COZAAR) 100 MG tablet Take 100 mg by mouth daily.   Yes [provider]  ?melatonin 5 MG TABS Take 5 mg by mouth at bedtime.   Yes [provider]  ?metFORMIN (GLUCOPHAGE) 1000 MG tablet Take 1 tablet by mouth 2 (two) times daily.   Yes [provider]  ?Multiple Vitamin (MULTIVITAMIN) tablet Take 1 tablet by mouth daily.   Yes [provider]  ?Pancrelipase, Lip-Prot-Amyl, (ZENPEP) 5000-24000 units CPEP Take by  mouth. 4 tablets in the evening   Yes [provider]  ?timolol (TIMOPTIC) 0.5 % ophthalmic solution SMARTSIG:In Eye(s) 03/16/21  Yes [provider]  ?Dupilumab ([place] [place]) Inject into the skin. 1 shot every 14 days    [provider]  ?PFIZER COVID-19 VAC BIVALENT injection  06/17/21   [provider]  ? ? ?Current Outpatient Medications  ?Medication Sig Dispense Refill  ? amLODipine (NORVASC) 5 MG tablet Take 5 mg by mouth daily.    ? Apoaequorin (PREVAGEN PO) Take by mouth. 1 in the morning    ? Cholecalciferol (VITAMIN D3) 50 MCG (2000 UT) TABS Take by mouth. 1 every other day    ? citalopram (CELEXA) 40 MG tablet  Take 40 mg by mouth daily.    ? Cyanocobalamin (VITAMIN B-12) 5000 MCG TBDP Take by mouth. 1 daily    ? diphenhydrAMINE-APAP, sleep, (TYLENOL PM EXTRA STRENGTH PO) Take by mouth. 1 at bedtime    ? gentamicin (GARAMYCIN) 0.3 % ophthalmic solution gentamicin 0.3 % eye drops    ? Iron, Ferrous Sulfate, 325 (65 Fe) MG TABS Take 325 mg by mouth daily.    ? latanoprost (XALATAN) 0.005 % ophthalmic solution SMARTSIG:In Eye(s)    ? levothyroxine (SYNTHROID) 75 MCG tablet Take 75 mcg by mouth daily before breakfast.    ? losartan (COZAAR) 100 MG tablet Take 100 mg by mouth daily.    ? melatonin 5 MG TABS Take 5 mg by mouth at bedtime.    ? metFORMIN (GLUCOPHAGE) 1000 MG tablet Take 1 tablet by mouth 2 (two) times daily.    ? Multiple Vitamin (MULTIVITAMIN) tablet Take 1 tablet by mouth daily.    ? Pancrelipase, Lip-Prot-Amyl, (ZENPEP) 5000-24000 units CPEP Take by mouth. 4 tablets in the evening    ? timolol (TIMOPTIC) 0.5 % ophthalmic solution SMARTSIG:In Eye(s)    ? Dupilumab (DUPIXENT [place]) Inject into the skin. 1 shot every 14 days    ? PFIZER COVID-19 VAC BIVALENT injection     ? ?Current Facility-Administered Medications  ?Medication Dose Route Frequency Provider Last Rate Last Admin  ? 0.9 %  sodium chloride infusion  500 mL Intravenous Once Sharyn Creamer, MD      ? ? ?Allergies as of 09/05/2021 - Review Complete 09/05/2021  ?Allergen Reaction Noted  ? Balsam Itching 07/08/2021  ? Clobetasone Itching 07/08/2021  ? Hydrocortisone butyrate  07/08/2021  ? Latex  07/08/2021  ? Neomycin sulfate [neomycin] Itching 07/08/2021  ? Nickel Itching 07/08/2021  ? Other  07/08/2021  ? Potassium dichromate Itching 07/08/2021  ? Thimerosal Itching 07/08/2021  ? Thiourea Itching 07/08/2021  ? Wool alcohol [lanolin] Itching 07/08/2021  ? ? ?Family History  ?Problem Relation Age of Onset  ? Diabetes Mother   ? Depression Mother   ? Asthma Mother   ? Skin cancer Mother   ? Hearing loss Father   ? Diabetes Father   ? Hyperlipidemia Brother    ? Diabetes Brother   ? Asthma Brother   ? Dementia Brother   ? Heart attack Brother   ? Depression Daughter   ? COPD Daughter   ? Asthma Daughter   ? Depression Daughter   ? Depression Son   ? Arthritis Son   ? ? ?Social History  ? ?Socioeconomic History  ? Marital status: Single  ?  Spouse name: Not on file  ? Number of children: 3  ? Years of education: Not on file  ? Highest education level: Not  on file  ?Occupational History  ? Occupation: retired  ?Tobacco Use  ? Smoking status: Former  ?  Packs/day: 2.00  ?  Years: 35.00  ?  Pack years: 70.00  ?  Types: Cigarettes  ?  Quit date: 1997  ?  Years since quitting: 26.1  ? Smokeless tobacco: Never  ?Vaping Use  ? Vaping Use: Never used  ?Substance and Sexual Activity  ? Alcohol use: Yes  ?  Comment: maybe twice a month  ? Drug use: Not Currently  ? Sexual activity: Not Currently  ?Other Topics Concern  ? Not on file  ?Social History Narrative  ? Not on file  ? ?Social Determinants of Health  ? ?Financial Resource Strain: Not on file  ?Food Insecurity: Not on file  ?Transportation Needs: Not on file  ?Physical Activity: Not on file  ?Stress: Not on file  ?Social Connections: Not on file  ?Intimate Partner Violence: Not on file  ? ? ?Physical Exam: ?Vital signs in last 24 hours: ?BP 120/71   Pulse 72   Temp (!) 97.3 ?F (36.3 ?C) (Temporal)   Ht '5\' 8"'$  (1.727 m)   Wt 167 lb (75.8 kg)   SpO2 97%   BMI 25.39 kg/m?  ?GEN: NAD ?EYE: Sclerae anicteric ?ENT: MMM ?CV: Non-tachycardic ?Pulm: No increased work of breathing ?GI: Soft, NT/ND ?NEURO:  Alert & Oriented ? ? ?[name], MD ?[facility] ? ?09/05/2021 3:17 PM ? ?

## 2021-09-05 NOTE — Progress Notes (Signed)
Pt's states no medical or surgical changes since previsit or office visit. 

## 2021-09-05 NOTE — Op Note (Addendum)
[place] ?Patient Name: Deanna Ramirez ?Procedure Date: 09/05/2021 3:15 PM ?MRN: [id number] ?Endoscopist: Sonny Masters "[name] ,  ?Age: 76 ?Referring MD:  ?Date of Birth: 1945-09-04 ?Gender: Female ?Account #: [id number] ?Procedure:                Colonoscopy ?Indications:              Chronic diarrhea, Iron deficiency anemia ?Medicines:                Monitored Anesthesia Care ?Procedure:                Pre-Anesthesia Assessment: ?                          - Prior to the procedure, a History and Physical  ?                          was performed, and patient medications and  ?                          allergies were reviewed. The patient's tolerance of  ?                          previous anesthesia was also reviewed. The risks  ?                          and benefits of the procedure and the sedation  ?                          options and risks were discussed with the patient.  ?                          All questions were answered, and informed consent  ?                          was obtained. Prior Anticoagulants: The patient has  ?                          taken no previous anticoagulant or antiplatelet  ?                          agents. ASA Grade Assessment: II - A patient with  ?                          mild systemic disease. After reviewing the risks  ?                          and benefits, the patient was deemed in  ?                          satisfactory condition to undergo the procedure. ?                          After obtaining informed consent, the colonoscope  ?  was passed under direct vision. Throughout the  ?                          procedure, the patient's blood pressure, pulse, and  ?                          oxygen saturations were monitored continuously. The  ?                          Colonoscope was introduced through the anus and  ?                          advanced to the the terminal ileum. The colonoscopy  ?                          was performed  without difficulty. The patient  ?                          tolerated the procedure well. The quality of the  ?                          bowel preparation was good. The terminal ileum,  ?                          ileocecal valve, appendiceal orifice, and rectum  ?                          were photographed. ?Scope In: 3:56:38 PM ?Scope Out: 4:21:57 PM ?Scope Withdrawal Time: 0 hours 16 minutes 23 seconds  ?Total Procedure Duration: 0 hours 25 minutes 19 seconds  ?Findings:                 The terminal ileum appeared normal. ?                          A 2 mm polyp was found in the cecum. The polyp was  ?                          sessile. The polyp was removed with a cold biopsy  ?                          forceps. Resection and retrieval were complete. ?                          Multiple small and large-mouthed diverticula were  ?                          found in the sigmoid colon. ?                          A 3 mm polyp was found in the rectum. The polyp was  ?                          sessile. The polyp was removed with  a cold snare.  ?                          Resection and retrieval were complete. ?                          Non-bleeding internal hemorrhoids were found during  ?                          retroflexion. ?                          Biopsies for histology were taken with a cold  ?                          forceps from the entire colon for evaluation of  ?                          microscopic colitis. ?Complications:            No immediate complications. ?Estimated Blood Loss:     Estimated blood loss was minimal. ?Impression:               - The examined portion of the ileum was normal. ?                          - One 2 mm polyp in the cecum, removed with a cold  ?                          biopsy forceps. Resected and retrieved. ?                          - Diverticulosis in the sigmoid colon. ?                          - One 3 mm polyp in the rectum, removed with a cold  ?                           snare. Resected and retrieved. ?                          - Non-bleeding internal hemorrhoids. ?                          - Biopsies were taken with a cold forceps from the  ?                          entire colon for evaluation of microscopic colitis. ?Recommendation:           - Discharge patient to home (with escort). ?                          - Await pathology results. ?                          - The findings and recommendations were discussed  ?  with the patient. ?                          - Return to GI clinic in 1 month. ?Georgian Co,  ?09/05/2021 4:29:44 PM ?

## 2021-09-05 NOTE — Progress Notes (Signed)
Called to room to assist during endoscopic procedure.  Patient ID and intended procedure confirmed with present staff. Received instructions for my participation in the procedure from the performing physician.  

## 2021-09-06 LAB — OVA AND PARASITE EXAMINATION
CONCENTRATE RESULT:: NONE SEEN
MICRO NUMBER:: 13079520
SPECIMEN QUALITY:: ADEQUATE
TRICHROME RESULT:: NONE SEEN

## 2021-09-07 ENCOUNTER — Telehealth: Payer: Self-pay

## 2021-09-07 NOTE — Telephone Encounter (Signed)
?  Follow up Call- ? ?Call back number 09/05/2021  ?Post procedure Call Back phone  # [phone]  ?Permission to leave phone message Yes  ?  ? ?Patient questions: ? ?Do you have a fever, pain , or abdominal swelling? No. ?Pain Score  0 * ? ?Have you tolerated food without any problems? Yes.   ? ?Have you been able to return to your normal activities? Yes.   ? ?Do you have any questions about your discharge instructions: ?Diet   No. ?Medications  No. ?Follow up visit  No. ? ?Do you have questions or concerns about your Care? No. ? ?Actions: ?* If pain score is 4 or above: ?No action needed, pain <4. ? ? ?

## 2021-09-09 LAB — PANCREATIC ELASTASE, FECAL: Pancreatic Elastase-1, Stool: >500 mcg/g

## 2021-09-25 ENCOUNTER — Encounter: Payer: Self-pay | Admitting: Internal Medicine

## 2021-10-10 ENCOUNTER — Ambulatory Visit: Payer: Medicare HMO | Admitting: Internal Medicine

## 2021-10-10 ENCOUNTER — Encounter: Payer: Self-pay | Admitting: Internal Medicine

## 2021-10-10 VITALS — BP 118/82 | HR 59 | Ht 67.0 in | Wt 163.0 lb

## 2021-10-10 DIAGNOSIS — R197 Diarrhea, unspecified: Secondary | ICD-10-CM | POA: Diagnosis not present

## 2021-10-10 DIAGNOSIS — K31A0 Gastric intestinal metaplasia, unspecified: Secondary | ICD-10-CM

## 2021-10-10 DIAGNOSIS — D509 Iron deficiency anemia, unspecified: Secondary | ICD-10-CM

## 2021-10-10 DIAGNOSIS — K317 Polyp of stomach and duodenum: Secondary | ICD-10-CM

## 2021-10-10 DIAGNOSIS — R159 Full incontinence of feces: Secondary | ICD-10-CM

## 2021-10-10 DIAGNOSIS — K2940 Chronic atrophic gastritis without bleeding: Secondary | ICD-10-CM | POA: Diagnosis not present

## 2021-10-10 DIAGNOSIS — Z800 Family history of malignant neoplasm of digestive organs: Secondary | ICD-10-CM | POA: Diagnosis not present

## 2021-10-10 NOTE — Progress Notes (Signed)
? ?Chief Complaint: Diarrhea ? ?HPI : 76 year old female with history of DM, DVT/PE, anal fissure, urinary incontinence presents for follow up of diarrhea ? ?Interval History: She had relief in her diarrhea for about 3-4 days after her colonoscopy, but then she has had recurrence in her explosive diarrhea and fecal incontinence. She has to wear adult diapers due to concerns about having fecal incontinence. She does have aunts and uncles who have had stomach cancer in the past.  Patient has been taking her iron supplementation as recommended. ? ?Wt Readings from Last 3 Encounters:  ?10/10/21 163 lb (73.9 kg)  ?09/05/21 167 lb (75.8 kg)  ?08/31/21 167 lb (75.8 kg)  ? ?Current Outpatient Medications  ?Medication Sig Dispense Refill  ? amLODipine (NORVASC) 5 MG tablet Take 5 mg by mouth daily.    ? Apoaequorin (PREVAGEN PO) Take by mouth. 1 in the morning    ? Cholecalciferol (VITAMIN D3) 50 MCG (2000 UT) TABS Take by mouth. 1 every other day    ? citalopram (CELEXA) 40 MG tablet Take 40 mg by mouth daily.    ? Cyanocobalamin (VITAMIN B-12) 5000 MCG TBDP Take by mouth. 1 daily    ? diphenhydrAMINE-APAP, sleep, (TYLENOL PM EXTRA STRENGTH PO) Take by mouth. 1 at bedtime    ? Dupilumab (DUPIXENT [place]) Inject into the skin. 1 shot every 14 days    ? gentamicin (GARAMYCIN) 0.3 % ophthalmic solution gentamicin 0.3 % eye drops    ? Iron, Ferrous Sulfate, 325 (65 Fe) MG TABS Take 325 mg by mouth daily.    ? latanoprost (XALATAN) 0.005 % ophthalmic solution SMARTSIG:In Eye(s)    ? levothyroxine (SYNTHROID) 75 MCG tablet Take 75 mcg by mouth daily before breakfast.    ? losartan (COZAAR) 100 MG tablet Take 100 mg by mouth daily.    ? melatonin 5 MG TABS Take 5 mg by mouth at bedtime.    ? metFORMIN (GLUCOPHAGE) 1000 MG tablet Take 1 tablet by mouth 2 (two) times daily.    ? Multiple Vitamin (MULTIVITAMIN) tablet Take 1 tablet by mouth daily.    ? Pancrelipase, Lip-Prot-Amyl, (ZENPEP) 5000-24000 units CPEP Take by mouth. 4 tablets  in the evening    ? PFIZER COVID-19 VAC BIVALENT injection     ? timolol (TIMOPTIC) 0.5 % ophthalmic solution SMARTSIG:In Eye(s)    ? ?Current Facility-Administered Medications  ?Medication Dose Route Frequency Provider Last Rate Last Admin  ? 0.9 %  sodium chloride infusion  500 mL Intravenous Once Sharyn Creamer, MD      ? ?Review of Systems: ?All systems reviewed and negative except where noted in HPI.  ? ?Physical Exam: ?BP 118/82   Pulse (!) 59   Ht '5\' 7"'$  (1.702 m)   Wt 163 lb (73.9 kg)   SpO2 95%   BMI 25.53 kg/m?  ?Constitutional: Pleasant,well-developed, female in no acute distress. ?HEENT: Normocephalic and atraumatic. Conjunctivae are normal. No scleral icterus. ?Cardiovascular: Normal rate, regular rhythm.  ?Pulmonary/chest: Effort normal and breath sounds normal. No wheezing, rales or rhonchi. ?Abdominal: Soft, nondistended, nontender. Bowel sounds active throughout. There are no masses palpable. No hepatomegaly. ?Extremities: No edema ?Neurological: Alert and oriented to person place and time. ?Skin: Skin is warm and dry. No rashes noted. ?Psychiatric: Normal mood and affect. Behavior is normal. ? ?Labs 07/2021: TSH nml. CBC with low Hb of 10.8, MCV 92. CMP unremarkable. ? ?Labs 08/2021: Vit B12 and folate nml. Ferritin low at 29.2. Iron sat low at 16.8.  GI profile negative. O&P negative. C dif negative. Pancreatic elastase was normal. ? ?CT A/P w/contrast 08/24/21: ? ? ?KUB 09/01/21: ?IMPRESSION: ?Constipation. No bowel obstruction. ? ?EGD/colonoscopy 02/16/15: 2 cm sliding hiatal hernia with hiatus noted at 40 cm from the incisors. Normal stomach. Normal duodenum with biopsies taken to rule out celiac disease. Colonoscopy showed diverticulosis in the left colon. ?Path: ?Benign duodenal mucosa ? ?EGD 09/05/21: ?- Normal esophagus. Biopsied. ?- Gastritis. Biopsied. ?- Two gastric polyps. Resected and retrieved. ?- Normal examined duodenum. Biopsied. ?Path: ?1. Surgical [P], duodenal ?- DUODENAL MUCOSA WITH  NO SPECIFIC HISTOPATHOLOGIC CHANGES ?- NEGATIVE FOR INCREASED INTRAEPITHELIAL LYMPHOCYTES OR VILLOUS ARCHITECTURAL CHANGES ?2. Surgical [P], gastric antrum and gastric body - gastritis ?- ANTRAL TYPE GASTRIC MUCOSA WITH PATCHY CHRONIC GASTRITIS, ATROPHY, INTESTINAL METAPLASIA AND MICRONODULAR ECL CELL HYPERPLASIA, CONSISTENT WITH ATROPHIC AUTOIMMUNE GASTRITIS. SEE NOTE ?- NEGATIVE FOR DYSPLASIA ?- HELICOBACTER PYLORI-LIKE ORGANISMS ARE NOT IDENTIFIED ON ROUTINE H&E STAIN ?3. Surgical [P], gastric polyp, polyp (2) ?- GASTRIC HYPERPLASTIC POLYP(S) WITH INTESTINAL METAPLASIA ?- NEGATIVE FOR DYSPLASIA ?4. Surgical [P], esophageal ?- ESOPHAGEAL SQUAMOUS MUCOSA WITH NO SPECIFIC HISTOPATHOLOGIC CHANGES ?- NEGATIVE FOR INCREASED INTRAEPITHELIAL EOSINOPHILS ? ?Colonoscopy 09/05/21: ?- The examined portion of the ileum was normal. ?- One 2 mm polyp in the cecum, removed with a cold biopsy forceps. Resected and retrieved. ?- Diverticulosis in the sigmoid colon. ?- One 3 mm polyp in the rectum, removed with a cold snare. Resected and retrieved. ?- Non-bleeding internal hemorrhoids. ?- Biopsies were taken with a cold forceps from the entire colon for evaluation of microscopic colitis. ?Path: ?5. Surgical [P], colon, cecum and rectum, polyp (2) ?- HYPERPLASTIC POLYP ?- OTHER FRAGMENT OF POLYPOID COLONIC MUCOSA WITH A PROMINENT LYMPHOID AGGREGATE ?6. Surgical [P], random colon - diarrhea ?- COLONIC MUCOSA WITH NO SPECIFIC HISTOPATHOLOGIC CHANGES ?- NEGATIVE FOR ACUTE INFLAMMATION, INCREASED INTRAEPITHELIAL LYMPHOCYTES OR THICKENED SUBEPITHELIAL COLLAGEN TABLE ? ?ASSESSMENT AND PLAN: ?Overflow diarrhea ?Fecal incontinence ?Iron deficiency anemia ?History of gastric hyperplastic polyp ?Gastric intestinal metaplasia ?Autoimmune atrophic gastritis ?Family history of gastric cancer ?Patient presents with diarrhea and fecal incontinence over the last 1 to 2 years. Patient had a KUB that showed significant stool burden. Thus I do suspect  that her diarrhea symptoms are most likely due to overflow incontinence and diarrhea.  She did have relief after doing a whole colonoscopy laxative preparation, which also suggests that her diarrhea may be related to underlying constipation. We will have the patient start some MiraLAX regularly to see if this will help with her overflow incontinence.  Her stool studies showed that she does not have pancreatic insufficiency so we will have her stop her Zenpep medication.  Patient has been compliant with her iron supplementation for iron deficiency anemia.  I suspect at this time that her iron deficiency anemia is most likely due to her underlying autoimmune atrophic gastritis.  We will plan to continue endoscopic surveillance for gastric cancer.   ?- Start Miralax BID ?- Okay to stop Zenpep ?- Will change her EGD intervals to every 2 years due to fam hx of stomach cancer, autoimmune atrophic gastritis, and hyperplastic gastric polyp ?- RTC 2 months. Consider referral to pelvic floor and further adjustment of her laxative regimen in the future ? ?[name], MD ? ?

## 2021-10-10 NOTE — Patient Instructions (Signed)
If you are age 77 or older, your body mass index should be between 23-30. Your Body mass index is 25.53 kg/m?Marland Kitchen If this is out of the aforementioned range listed, please consider follow up with your Primary Care Provider. ? ?If you are age 8 or younger, your body mass index should be between 19-25. Your Body mass index is 25.53 kg/m?Marland Kitchen If this is out of the aformentioned range listed, please consider follow up with your Primary Care Provider.  ? ?Start Miralax twice daily.  ? ?Stop Zenpep. ? ?The [place] [facility] providers would like to encourage you to use [facility] to communicate with providers for non-urgent requests or questions.  Due to long hold times on the telephone, sending your provider a message by [facility] may be a faster and more efficient way to get a response.  Please allow 48 business hours for a response.  Please remember that this is for non-urgent requests.  ? ?It was a pleasure to see you today! ? ?Thank you for trusting me with your gastrointestinal care!   ? ? ? ?

## 2021-11-09 ENCOUNTER — Ambulatory Visit: Payer: Medicare HMO | Admitting: Nurse Practitioner

## 2021-11-11 DIAGNOSIS — I700 Atherosclerosis of aorta: Secondary | ICD-10-CM | POA: Diagnosis not present

## 2021-11-11 DIAGNOSIS — R42 Dizziness and giddiness: Secondary | ICD-10-CM | POA: Diagnosis not present

## 2021-11-11 DIAGNOSIS — R001 Bradycardia, unspecified: Secondary | ICD-10-CM | POA: Diagnosis not present

## 2021-11-11 DIAGNOSIS — Z888 Allergy status to other drugs, medicaments and biological substances status: Secondary | ICD-10-CM | POA: Diagnosis not present

## 2021-11-11 DIAGNOSIS — I10 Essential (primary) hypertension: Secondary | ICD-10-CM | POA: Diagnosis not present

## 2021-11-11 DIAGNOSIS — Z89429 Acquired absence of other toe(s), unspecified side: Secondary | ICD-10-CM | POA: Diagnosis not present

## 2021-11-11 DIAGNOSIS — Z91048 Other nonmedicinal substance allergy status: Secondary | ICD-10-CM | POA: Diagnosis not present

## 2021-11-11 DIAGNOSIS — R079 Chest pain, unspecified: Secondary | ICD-10-CM | POA: Diagnosis not present

## 2021-11-11 DIAGNOSIS — E119 Type 2 diabetes mellitus without complications: Secondary | ICD-10-CM | POA: Diagnosis not present

## 2021-11-11 DIAGNOSIS — R200 Anesthesia of skin: Secondary | ICD-10-CM | POA: Diagnosis not present

## 2021-11-11 DIAGNOSIS — R072 Precordial pain: Secondary | ICD-10-CM | POA: Diagnosis not present

## 2021-11-18 ENCOUNTER — Ambulatory Visit (INDEPENDENT_AMBULATORY_CARE_PROVIDER_SITE_OTHER): Payer: Medicare HMO | Admitting: Nurse Practitioner

## 2021-11-18 ENCOUNTER — Encounter: Payer: Self-pay | Admitting: Nurse Practitioner

## 2021-11-18 VITALS — BP 134/72 | HR 75 | Temp 97.2°F | Resp 14 | Ht 67.0 in | Wt 164.4 lb

## 2021-11-18 DIAGNOSIS — E118 Type 2 diabetes mellitus with unspecified complications: Secondary | ICD-10-CM | POA: Diagnosis not present

## 2021-11-18 DIAGNOSIS — R059 Cough, unspecified: Secondary | ICD-10-CM

## 2021-11-18 DIAGNOSIS — R42 Dizziness and giddiness: Secondary | ICD-10-CM | POA: Insufficient documentation

## 2021-11-18 DIAGNOSIS — R0789 Other chest pain: Secondary | ICD-10-CM | POA: Diagnosis not present

## 2021-11-18 DIAGNOSIS — R7989 Other specified abnormal findings of blood chemistry: Secondary | ICD-10-CM | POA: Diagnosis not present

## 2021-11-18 DIAGNOSIS — R5383 Other fatigue: Secondary | ICD-10-CM

## 2021-11-18 DIAGNOSIS — E039 Hypothyroidism, unspecified: Secondary | ICD-10-CM

## 2021-11-18 LAB — COMPREHENSIVE METABOLIC PANEL
ALT: 10 U/L (ref 0–35)
AST: 13 U/L (ref 0–37)
Albumin: 4 g/dL (ref 3.5–5.2)
Alkaline Phosphatase: 58 U/L (ref 39–117)
BUN: 13 mg/dL (ref 6–23)
CO2: 26 mEq/L (ref 19–32)
Calcium: 9.1 mg/dL (ref 8.4–10.5)
Chloride: 105 mEq/L (ref 96–112)
Creatinine, Ser: 0.83 mg/dL (ref 0.40–1.20)
GFR: 68.48 mL/min (ref >60.00)
Glucose, Bld: 85 mg/dL (ref 70–99)
Potassium: 4.4 mEq/L (ref 3.5–5.1)
Sodium: 141 mEq/L (ref 135–145)
Total Bilirubin: 0.7 mg/dL (ref 0.2–1.2)
Total Protein: 6.6 g/dL (ref 6.0–8.3)

## 2021-11-18 LAB — CBC WITH DIFFERENTIAL/PLATELET
Basophils Absolute: 0.1 10*3/uL (ref 0.0–0.1)
Basophils Relative: 1.1 % (ref 0.0–3.0)
Eosinophils Absolute: 0.2 10*3/uL (ref 0.0–0.7)
Eosinophils Relative: 3.1 % (ref 0.0–5.0)
HCT: 35.4 % — ABNORMAL LOW (ref 36.0–46.0)
Hemoglobin: 11.8 g/dL — ABNORMAL LOW (ref 12.0–15.0)
Lymphocytes Relative: 22.7 % (ref 12.0–46.0)
Lymphs Abs: 1.8 10*3/uL (ref 0.7–4.0)
MCHC: 33.2 g/dL (ref 30.0–36.0)
MCV: 95 fl (ref 78.0–100.0)
Monocytes Absolute: 0.6 10*3/uL (ref 0.1–1.0)
Monocytes Relative: 7.2 % (ref 3.0–12.0)
Neutro Abs: 5.4 10*3/uL (ref 1.4–7.7)
Neutrophils Relative %: 65.9 % (ref 43.0–77.0)
Platelets: 304 10*3/uL (ref 150.0–400.0)
RBC: 3.73 Mil/uL — ABNORMAL LOW (ref 3.87–5.11)
RDW: 13.7 % (ref 11.5–15.5)
WBC: 8.1 10*3/uL (ref 4.0–10.5)

## 2021-11-18 LAB — IBC + FERRITIN
Ferritin: 37.5 ng/mL (ref 10.0–291.0)
Iron: 79 ug/dL (ref 42–145)
Saturation Ratios: 25.8 % (ref 20.0–50.0)
TIBC: 306.6 ug/dL (ref 250.0–450.0)
Transferrin: 219 mg/dL (ref 212.0–360.0)

## 2021-11-18 LAB — VITAMIN B12: Vitamin B-12: 369 pg/mL (ref 211–911)

## 2021-11-18 LAB — TSH: TSH: 2.12 u[IU]/mL (ref 0.35–5.50)

## 2021-11-18 LAB — HEMOGLOBIN A1C: Hgb A1c MFr Bld: 5.8 % (ref 4.6–6.5)

## 2021-11-18 MED ORDER — MECLIZINE HCL 12.5 MG PO TABS: 12.5000 mg | ORAL_TABLET | Freq: Three times a day (TID) | ORAL | 0 refills | Status: AC | PRN

## 2021-11-18 MED ORDER — BENZONATATE 100 MG PO CAPS
100.0000 mg | ORAL_CAPSULE | Freq: Three times a day (TID) | ORAL | 0 refills | Status: AC | PRN
Start: 2021-11-18 — End: 2021-11-28

## 2021-11-18 NOTE — Assessment & Plan Note (Signed)
Ambiguous in nature for the past 2 weeks.  Pending labs

## 2021-11-18 NOTE — Assessment & Plan Note (Signed)
Patient has been taking Delsym will patient to discontinue pull back on Delsym I will send in some Tessalon Perles 100 mg 3 times daily as needed cough.  See if maybe the Delsym is increasing the lightheadedness

## 2021-11-18 NOTE — Assessment & Plan Note (Signed)
We will recheck TSH today in light of new symptoms.  Pending lab result

## 2021-11-18 NOTE — Progress Notes (Signed)
Established Patient Office Visit  Subjective   Patient ID: Deanna Ramirez, female    DOB: Aug 02, 1945  Age: 76 y.o. MRN: [id number]  Chief Complaint  Patient presents with   ER follow up    Does not feel well, still has chest tightness, dizzy x 2 weeks, was off and on and now it is present all the time.    HPI  Chest heaviness: states that she has heaviness on her upper chest that is in between and she is having a cough. States that she feels like she has phlegm that will not come up. Cough ahs been a week.  No sick contacts  Dizziness: for a week that is presistant better with sitting worse with movement and described as a lightheadness. Described as wavy image. Glaucoma on the right eye but no increase in change. Multiple trips to the btahroom and yellow. States that fluid intake has been good  Fatigue: Feel washed out all the time. States that she had a better day had breakfast and then laid back down.  Patient then woke up from the nap and made soup per her report which made her more fatigued.  States she laid back down after having some soup and slept the entire night until the next morning.  Patient states she feels pale and her daughter also thinks she looks pale per patient's report    Review of Systems  Constitutional:  Positive for malaise/fatigue. Negative for chills and fever.  Respiratory:  Positive for cough. Negative for sputum production and shortness of breath.   Cardiovascular:  Positive for chest pain (Termed as tightness). Negative for palpitations.  Neurological:  Positive for dizziness (Termed more of lightheadedness) and weakness.     Objective:     BP 134/72   Pulse 75   Temp (!) 97.2 F (36.2 C)   Resp 14   Ht '5\' 7"'$  (1.702 m)   Wt 164 lb 6 oz (74.6 kg)   SpO2 98%   BMI 25.74 kg/m    Physical Exam Vitals and nursing note reviewed.  Constitutional:      Appearance: Normal appearance.  HENT:     Right Ear: Tympanic membrane, ear canal and external  ear normal.     Left Ear: Tympanic membrane, ear canal and external ear normal.     Mouth/Throat:     Mouth: Mucous membranes are moist.     Pharynx: Oropharynx is clear.  Eyes:     Extraocular Movements: Extraocular movements intact.     Pupils: Pupils are equal, round, and reactive to light.  Cardiovascular:     Rate and Rhythm: Normal rate and regular rhythm.     Pulses: Normal pulses.     Heart sounds: Normal heart sounds.  Pulmonary:     Effort: Pulmonary effort is normal.     Breath sounds: Normal breath sounds.  Abdominal:     General: Bowel sounds are normal.  Musculoskeletal:     Right lower leg: No edema.     Left lower leg: No edema.  Lymphadenopathy:     Cervical: No cervical adenopathy.  Skin:    General: Skin is warm.  Neurological:     General: No focal deficit present.     Mental Status: She is alert.     Cranial Nerves: Cranial nerves 2-12 are intact.     Motor: No weakness.     Coordination: Coordination normal. Finger-Nose-Finger Test normal.     Deep Tendon Reflexes:  Reflex Scores:      Bicep reflexes are 2+ on the right side and 2+ on the left side.      Patellar reflexes are 2+ on the right side and 2+ on the left side.    Comments: Bilateral upper and lower extremity strength 5/5     No results found for any visits on 11/18/21.    The ASCVD Risk score (Arnett DK, et al., 2019) failed to calculate for the following reasons:   Cannot find a previous HDL lab   Cannot find a previous total cholesterol lab    Assessment & Plan:   Problem List Items Addressed This Visit       Endocrine   Hypothyroidism    We will recheck TSH today in light of new symptoms.  Pending lab result       Controlled type 2 diabetes mellitus with complication, without long-term current use of insulin (HCC)   Relevant Orders   Hemoglobin A1c     Other   Chest heaviness - Primary    Described higher up in the chest.  Was seen in the emergency department in  acute ACS ruled out.  Repeat EKG in office today cannot see the physical image from emergency department but no concerning things noted on today's EKG.       Relevant Orders   EKG 12-Lead   Other fatigue    Ambiguous in nature for the past 2 weeks.  Pending labs       Relevant Orders   Vitamin B12   CBC with Differential/Platelet   Comprehensive metabolic panel   TSH   Cough    Patient has been taking Delsym will patient to discontinue pull back on Delsym I will send in some Tessalon Perles 100 mg 3 times daily as needed cough.  See if maybe the Delsym is increasing the lightheadedness       Relevant Medications   benzonatate (TESSALON PERLES) 100 MG capsule   Abnormal CBC    Abnormal CBC patient already on iron supplementation we will check CBC along with B12 and iron panel plus ferritin       Relevant Orders   Vitamin B12   CBC with Differential/Platelet   IBC + Ferritin   Lightheadedness    Ambiguous and nonspecific.  Patient has no nystagmus in office.  But can be elicited with movement of her head in certain position changes also while sitting still.  We will start patient on meclizine 12.5 mg 3 times daily as needed dizziness/lightheadedness.  Did give patient sedation precautions.  Pending lab results.  Patient's blood pressure within normal limits today.  Last office visit also within normal limits it was good in the emergency department also.  Patient does have blood pressure cuff at home I do not suspect hypertension being the cause       Relevant Medications   meclizine (ANTIVERT) 12.5 MG tablet    Return in about 4 months (around 03/21/2022) for DM recheck.    Romilda Garret, NP

## 2021-11-18 NOTE — Patient Instructions (Addendum)
Nice to see you today Stop taking the delsum and I wrote you a cough pill instead Follow up with the cardiologist ASAP Follow up with me in approx 4 months

## 2021-11-18 NOTE — Assessment & Plan Note (Signed)
Described higher up in the chest.  Was seen in the emergency department in acute ACS ruled out.  Repeat EKG in office today cannot see the physical image from emergency department but no concerning things noted on today's EKG.

## 2021-11-18 NOTE — Assessment & Plan Note (Signed)
Abnormal CBC patient already on iron supplementation we will check CBC along with B12 and iron panel plus ferritin

## 2021-11-18 NOTE — Assessment & Plan Note (Signed)
Ambiguous and nonspecific.  Patient has no nystagmus in office.  But can be elicited with movement of her head in certain position changes also while sitting still.  We will start patient on meclizine 12.5 mg 3 times daily as needed dizziness/lightheadedness.  Did give patient sedation precautions.  Pending lab results.  Patient's blood pressure within normal limits today.  Last office visit also within normal limits it was good in the emergency department also.  Patient does have blood pressure cuff at home I do not suspect hypertension being the cause

## 2021-11-23 ENCOUNTER — Ambulatory Visit: Payer: Medicare HMO | Admitting: Nurse Practitioner

## 2021-11-24 DIAGNOSIS — R079 Chest pain, unspecified: Secondary | ICD-10-CM | POA: Diagnosis not present

## 2021-12-12 ENCOUNTER — Ambulatory Visit: Payer: Medicare HMO | Admitting: Internal Medicine

## 2021-12-14 DIAGNOSIS — I2510 Atherosclerotic heart disease of native coronary artery without angina pectoris: Secondary | ICD-10-CM | POA: Diagnosis not present

## 2021-12-14 DIAGNOSIS — R079 Chest pain, unspecified: Secondary | ICD-10-CM | POA: Diagnosis not present

## 2021-12-19 ENCOUNTER — Other Ambulatory Visit: Payer: Self-pay | Admitting: Nurse Practitioner

## 2021-12-19 MED ORDER — CITALOPRAM HYDROBROMIDE 40 MG PO TABS: 40.0000 mg | ORAL_TABLET | Freq: Every day | ORAL | 1 refills | Status: AC

## 2021-12-19 MED ORDER — CITALOPRAM HYDROBROMIDE 40 MG PO TABS
40.0000 mg | ORAL_TABLET | Freq: Every day | ORAL | 1 refills | Status: DC
Start: 2021-12-19 — End: 2021-12-19

## 2021-12-19 NOTE — Telephone Encounter (Signed)
Patient advised. RX was sent to [facility] originally and needed to go to [place]. I called [facility] and cancelled RX with them. Re sent to [place] instead

## 2021-12-19 NOTE — Telephone Encounter (Signed)
[facility] has not refilled this medication for patient before yet

## 2021-12-19 NOTE — Addendum Note (Signed)
Addended by: Kris Mouton on: 12/19/2021 04:12 PM   Modules accepted: Orders

## 2021-12-19 NOTE — Telephone Encounter (Signed)
Encourage patient to contact the pharmacy for refills or they can request refills through [facility]  Did the patient contact the pharmacy:  Yes  LAST APPOINTMENT DATE:  5.19.2023  NEXT APPOINTMENT DATE: 9.19.2023  MEDICATION: citalopram (CELEXA) 40 MG tablet  Is the patient out of medication? Almost  If not, how much is left? 6 days left  Is this a 90 day supply: N/A  PHARMACY:  [facility] Phone Number: [phone]  Let patient know to contact pharmacy at the end of the day to make sure medication is ready. Please notify patient to allow 48-72 hours to process

## 2022-01-26 ENCOUNTER — Ambulatory Visit (INDEPENDENT_AMBULATORY_CARE_PROVIDER_SITE_OTHER): Payer: Medicare HMO | Admitting: Orthopaedic Surgery

## 2022-01-26 ENCOUNTER — Ambulatory Visit (INDEPENDENT_AMBULATORY_CARE_PROVIDER_SITE_OTHER): Payer: Medicare HMO

## 2022-01-26 DIAGNOSIS — M1711 Unilateral primary osteoarthritis, right knee: Secondary | ICD-10-CM

## 2022-01-26 DIAGNOSIS — M25552 Pain in left hip: Secondary | ICD-10-CM

## 2022-01-26 MED ORDER — TRIAMCINOLONE ACETONIDE 40 MG/ML IJ SUSP
80.0000 mg | INTRAMUSCULAR | Status: AC | PRN
  Administered 2022-01-26: 80 mg via INTRA_ARTICULAR

## 2022-01-26 MED ORDER — LIDOCAINE HCL 1 % IJ SOLN
4.0000 mL | INTRAMUSCULAR | Status: AC | PRN
  Administered 2022-01-26: 4 mL

## 2022-01-26 NOTE — Progress Notes (Signed)
Chief Complaint: Right knee pain     History of Present Illness:   01/26/2022: Presents today with worsening right knee pain.  She states that she recently had to go up several steps as they rented a house and this had 3 floors.  As result she has been overusing the left hip and now is experiencing pain about the lateral aspect of the left hip.  Deanna Ramirez is a 76 y.o. female presents with right knee pain that has been going on for multiple years.  She does note that she has significant tricompartmental osteoarthritis.  She has been avoiding knee arthroplasty as she has a history of multiple procedures that resulted in infection including a left tibia fracture repair as well as a left shoulder arthroscopy.  Both times these required subsequent procedures as result she has had trepidation about having the surgery for the right knee.  She does have a history of right knee ACL reconstruction done in 2005 on this knee.  Since that time she has had pain and feels like it gives out.  She states that periodically the knee will become very swollen at which time it is historically been aspirated and injected with steroids.  This does help significantly for up to 6 months.  This was last done in June 2022 in [place] where she is from.  She is recently moved to [place] to be near her daughter.    Surgical History:   Right knee ACL reconstruction 2005  PMH/PSH/Family History/Social History/Meds/Allergies:    Past Medical History:  Diagnosis Date  . Allergy   . Anal fissure   . Depression   . Diabetes mellitus without complication ([place])   . Glaucoma   . History of DVT of lower extremity    also had a PE  . Hypertension   . Urine incontinence     Social History   Socioeconomic History  . Marital status: Single    Spouse name: Not on file  . Number of children: 3  . Years of education: Not on file  . Highest education level: Not on file  Occupational  History  . Occupation: retired  Tobacco Use  . Smoking status: Former    Packs/day: 2.00    Years: 35.00    Total pack years: 70.00    Types: Cigarettes    Quit date: 1997    Years since quitting: 26.5  . Smokeless tobacco: Never  Vaping Use  . Vaping Use: Never used  Substance and Sexual Activity  . Alcohol use: Yes    Comment: maybe twice a month  . Drug use: Not Currently  . Sexual activity: Not Currently  Other Topics Concern  . Not on file  Social History Narrative  . Not on file   Social Determinants of Health   Financial Resource Strain: Not on file  Food Insecurity: Not on file  Transportation Needs: Not on file  Physical Activity: Not on file  Stress: Not on file  Social Connections: Not on file   Family History  Problem Relation Age of Onset  . Diabetes Mother   . Depression Mother   . Asthma Mother   . Skin cancer Mother   . Hearing loss Father   . Diabetes Father   . Hyperlipidemia Brother   . Diabetes Brother   .  Asthma Brother   . Dementia Brother   . Heart attack Brother   . Depression Daughter   . COPD Daughter   . Asthma Daughter   . Depression Daughter   . Depression Son   . Arthritis Son    Allergies  Allergen Reactions  . Balsam Itching    BALSAM OF [place]  . Clobetasone Itching  . Hydrocortisone Butyrate   . Latex     itching  . Neomycin Sulfate [Neomycin] Itching  . Nickel Itching  . Other     HYDROCORTISONE AND TIXOCORTOL TYPE-itching  . Potassium Dichromate Itching    CHROMIUM  . Thimerosal Itching    Reacted after using these drops, ended up in ICU  . Thiourea Itching  . Wool Alcohol [Lanolin] Itching   Current Outpatient Medications  Medication Sig Dispense Refill  . amLODipine (NORVASC) 5 MG tablet Take 5 mg by mouth daily.    Marland Kitchen Apoaequorin (PREVAGEN PO) Take by mouth. 1 in the morning    . Cholecalciferol (VITAMIN D3) 50 MCG (2000 UT) TABS Take by mouth. 1 every other day    . citalopram (CELEXA) 40 MG tablet Take 1  tablet (40 mg total) by mouth daily. 90 tablet 1  . Cyanocobalamin (VITAMIN B-12) 5000 MCG TBDP Take by mouth. 1 daily    . diphenhydrAMINE-APAP, sleep, (TYLENOL PM EXTRA STRENGTH PO) Take by mouth. 1 at bedtime    . Dupilumab (DUPIXENT [place]) Inject into the skin. 1 shot every 14 days    . gentamicin (GARAMYCIN) 0.3 % ophthalmic solution gentamicin 0.3 % eye drops    . Iron, Ferrous Sulfate, 325 (65 Fe) MG TABS Take 325 mg by mouth daily.    Marland Kitchen latanoprost (XALATAN) 0.005 % ophthalmic solution SMARTSIG:In Eye(s)    . levothyroxine (SYNTHROID) 75 MCG tablet Take 75 mcg by mouth daily before breakfast.    . losartan (COZAAR) 100 MG tablet Take 100 mg by mouth daily.    . meclizine (ANTIVERT) 12.5 MG tablet Take 1 tablet (12.5 mg total) by mouth 3 (three) times daily as needed for dizziness. 30 tablet 0  . melatonin 5 MG TABS Take 5 mg by mouth at bedtime.    . metFORMIN (GLUCOPHAGE) 1000 MG tablet Take 1 tablet by mouth 2 (two) times daily.    . Multiple Vitamin (MULTIVITAMIN) tablet Take 1 tablet by mouth daily.    . Pancrelipase, Lip-Prot-Amyl, (ZENPEP) 5000-24000 units CPEP Take by mouth. 4 tablets in the evening    . timolol (TIMOPTIC) 0.5 % ophthalmic solution SMARTSIG:In Eye(s)     Current Facility-Administered Medications  Medication Dose Route Frequency Provider Last Rate Last Admin  . 0.9 %  sodium chloride infusion  500 mL Intravenous Once Sharyn Creamer, MD       No results found.  Review of Systems:   A ROS was performed including pertinent positives and negatives as documented in the HPI.  Physical Exam :   Constitutional: NAD and appears stated age Neurological: Alert and oriented Psych: Appropriate affect and cooperative There were no vitals taken for this visit.   Comprehensive Musculoskeletal Exam:      Musculoskeletal Exam  Gait Normal  Alignment Normal   Right Left  Inspection Normal Normal  Palpation    Tenderness Tricompartmental Mild  Crepitus Moderate None   Effusion Trace Mild  Range of Motion    Extension 0 0  Flexion 135 135  Strength    Extension 5/5 5/5  Flexion 5/5 5/5  Ligament  Exam     Generalized Laxity No No  Lachman Negative Negative   Pivot Shift Negative Negative  Anterior Drawer Negative Negative  Valgus at 0 Negative Negative  Valgus at 20 Negative Negative  Varus at 0 0 0  Varus at 20   0 0  Posterior Drawer at 90 0 0  Vascular/Lymphatic Exam    Edema None None  Venous Stasis Changes No No  Distal Circulation Normal Normal  Neurologic    Light Touch Sensation Intact Intact  Special Tests:     Tenderness about the left hip abductors with resisted abduction as well  Imaging:   Xray (4 views right knee): Tricompartmental osteoarthritis which is severe  X-ray AP pelvis 3 views left hip: Normal  I personally reviewed and interpreted the radiographs.   Assessment:   76 year old female with severe tricompartmental osteoarthritis she would like to proceed with a right knee ultrasound-guided injection today.  With regard to the left hip I do believe that this is a gluteus tendinitis that is associated with overuse of this side as she has been dependent on it compared to the right.  I do believe that injection of the right side will hopefully help to resolve this as well.  We will plan for right ultrasound-guided knee injection Plan :    -Plan for right knee ultrasound-guided knee injection after verbal consent obtained    Procedure Note  Patient: Deanna Ramirez             Date of Birth: 1945/12/14           MRN: [id number]             Visit Date: 01/26/2022  Procedures: Visit Diagnoses:  1. Pain in left hip     Large Joint Inj: R knee on 01/26/2022 12:23 PM Indications: pain Details: 22 G 1.5 in needle, ultrasound-guided anterior approach  Arthrogram: No  Medications: 4 mL lidocaine 1 %; 80 mg triamcinolone acetonide 40 MG/ML Outcome: tolerated well, no immediate complications Procedure, treatment  alternatives, risks and benefits explained, specific risks discussed. Consent was given by the patient. Immediately prior to procedure a time out was called to verify the correct patient, procedure, equipment, support staff and site/side marked as required. Patient was prepped and draped in the usual sterile fashion.          I personally saw and evaluated the patient, and participated in the management and treatment plan.  Vanetta Mulders, MD Attending Physician, Orthopedic Surgery  This document was dictated using Dragon voice recognition software. A reasonable attempt at proof reading has been made to minimize errors.

## 2022-01-27 DIAGNOSIS — H401113 Primary open-angle glaucoma, right eye, severe stage: Secondary | ICD-10-CM | POA: Diagnosis not present

## 2022-01-31 ENCOUNTER — Ambulatory Visit (INDEPENDENT_AMBULATORY_CARE_PROVIDER_SITE_OTHER): Payer: Medicare HMO | Admitting: *Deleted

## 2022-01-31 DIAGNOSIS — Z0000 Encounter for general adult medical examination without abnormal findings: Secondary | ICD-10-CM | POA: Diagnosis not present

## 2022-01-31 NOTE — Progress Notes (Signed)
Subjective:   Deanna Ramirez is a 76 y.o. female who presents for Medicare Annual (Subsequent) preventive examination. I connected with  Kaelea Gathright on 01/31/22 by a telephone enabled telemedicine application and verified that I am speaking with the correct person using two identifiers.   I discussed the limitations of evaluation and management by telemedicine. The patient expressed understanding and agreed to proceed.  Patient location: home  Provider location:  Tele-Health-home   Review of Systems     Cardiac Risk Factors include: advanced age (>5mn, >>63women);diabetes mellitus;hypertension     Objective:    Today's Vitals   There is no height or weight on file to calculate BMI.     01/31/2022   11:19 AM  Advanced Directives  Does Patient Have a Medical Advance Directive? Yes  Type of Advance Directive H[place] Chart? No - copy requested  Would patient like information on creating a medical advance directive? No - Patient declined    Current Medications (verified) Outpatient Encounter Medications as of 01/31/2022  Medication Sig   amLODipine (NORVASC) 5 MG tablet Take 5 mg by mouth daily.   Apoaequorin (PREVAGEN PO) Take by mouth. 1 in the morning   Cholecalciferol (VITAMIN D3) 50 MCG (2000 UT) TABS Take by mouth. 1 every other day   citalopram (CELEXA) 40 MG tablet Take 1 tablet (40 mg total) by mouth daily.   Cyanocobalamin (VITAMIN B-12) 5000 MCG TBDP Take by mouth. 1 daily   diphenhydrAMINE-APAP, sleep, (TYLENOL PM EXTRA STRENGTH PO) Take by mouth. 1 at bedtime   Dupilumab (DUPIXENT [place]) Inject into the skin. 1 shot every 14 days   gentamicin (GARAMYCIN) 0.3 % ophthalmic solution gentamicin 0.3 % eye drops   Iron, Ferrous Sulfate, 325 (65 Fe) MG TABS Take 325 mg by mouth daily.   latanoprost (XALATAN) 0.005 % ophthalmic solution SMARTSIG:In Eye(s)   levothyroxine (SYNTHROID) 75 MCG tablet Take 75 mcg by  mouth daily before breakfast.   losartan (COZAAR) 100 MG tablet Take 100 mg by mouth daily.   meclizine (ANTIVERT) 12.5 MG tablet Take 1 tablet (12.5 mg total) by mouth 3 (three) times daily as needed for dizziness.   melatonin 5 MG TABS Take 5 mg by mouth at bedtime.   metFORMIN (GLUCOPHAGE) 1000 MG tablet Take 1 tablet by mouth 2 (two) times daily.   Multiple Vitamin (MULTIVITAMIN) tablet Take 1 tablet by mouth daily.   Pancrelipase, Lip-Prot-Amyl, (ZENPEP) 5000-24000 units CPEP Take by mouth. 4 tablets in the evening   timolol (TIMOPTIC) 0.5 % ophthalmic solution SMARTSIG:In Eye(s)   Facility-Administered Encounter Medications as of 01/31/2022  Medication   0.9 %  sodium chloride infusion    Allergies (verified) Balsam, Clobetasone, Hydrocortisone butyrate, Latex, Neomycin sulfate [neomycin], Nickel, Other, Potassium dichromate, Thimerosal, Thiourea, and Wool alcohol [lanolin]   History: Past Medical History:  Diagnosis Date   Allergy    Anal fissure    Depression    Di[place] mellitus without complication [place]    Glaucoma    History of DVT of lower extremity    also had a PE   Hypertension    Urine incontinence    Past Surgical History:  Procedure Laterality Date   ABDOMINAL HYSTERECTOMY     total   ANKLE FRACTURE SURGERY Left 2005   multi compound fracture, 21 surgeries total   BREAST BIOPSY Left 1981   benign   CHOLECYSTECTOMY     COLONOSCOPY  ESOPHAGOGASTRODUODENOSCOPY     ROTATOR CUFF REPAIR Left 12/15/2019   rotator cuff and torn bysep T tendon   ROTATOR CUFF REPAIR Left    again on 12/30/19 to remove screws taht were put in earlier in June due to infection   WRIST SURGERY Left 2005   rod in it, 4 surgeries   Family History  Problem Relation Age of Onset   Diabetes Mother    Depression Mother    Asthma Mother    Skin cancer Mother    Hearing loss Father    Diabetes Father    Hyperlipidemia Brother    Diabetes Brother    Asthma Brother    Dementia  Brother    Heart attack Brother    Depression Daughter    COPD Daughter    Asthma Daughter    Depression Daughter    Depression Son    Arthritis Son    Social History   Socioeconomic History   Marital status: Single    Spouse name: Not on file   Number of children: 3   Years of education: Not on file   Highest education level: Not on file  Occupational History   Occupation: retired  Tobacco Use   Smoking status: Former    Packs/day: 2.00    Years: 35.00    Total pack years: 70.00    Types: Cigarettes    Quit date: 1997    Years since quitting: 26.5   Smokeless tobacco: Never  Vaping Use   Vaping Use: Never used  Substance and Sexual Activity   Alcohol use: Yes    Comment: maybe twice a month   Drug use: Not Currently   Sexual activity: Not Currently  Other Topics Concern   Not on file  Social History Narrative   Not on file   Social Determinants of Health   Financial Resource Strain: Low Risk  (01/31/2022)   Overall Financial Resource Strain (CARDIA)    Difficulty of Paying Living Expenses: Not hard at all  Food Insecurity: No Food Insecurity (01/31/2022)   Hunger Vital Sign    Worried About Running Out of Food in the Last Year: Never true    [place] in the Last Year: Never true  Transportation Needs: No Transportation Needs (01/31/2022)   PRAPARE - Hydrologist (Medical): No    Lack of Transportation (Non-Medical): No  Physical Activity: Insufficiently Active (01/31/2022)   Exercise Vital Sign    Days of Exercise per Week: 3 days    Minutes of Exercise per Session: 30 min  Stress: No Stress Concern Present (01/31/2022)   [place]    Feeling of Stress : Not at all  Social Connections: Unknown (01/31/2022)   Social Connection and Isolation Panel [NHANES]    Frequency of Communication with Friends and Family: More than three times a week    Frequency of Social  Gatherings with Friends and Family: Twice a week    Attends Religious Services: More than 4 times per year    Active Member of [employer] or Organizations: No    Attends Music therapist: Never    Marital Status: Not on file    Tobacco Counseling Counseling given: Not Answered   Clinical Intake:  Pre-visit preparation completed: Yes  Pain : No/denies pain     Nutritional Risks: None Diabetes: Yes CBG done?: No Did pt. bring in CBG monitor from home?: No  How often do you need to have someone help you when you read instructions, pamphlets, or other written materials from your doctor or pharmacy?: 1 - Never  Diabetic?  Yes  Nutrition Risk Assessment:  Has the patient had any N/V/D within the last 2 months?  No  Does the patient have any non-healing wounds?  No  Has the patient had any unintentional weight loss or weight gain?  No   Diabetes:  Is the patient diabetic?  Yes  If diabetic, was a CBG obtained today?  No  Did the patient bring in their glucometer from home?  No  How often do you monitor your CBG's? 2 x a week.   Financial Strains and Diabetes Management:  Are you having any financial strains with the device, your supplies or your medication? No .  Does the patient want to be seen by Chronic Care Management for management of their diabetes?  No  Would the patient like to be referred to a Nutritionist or for Diabetic Management?  No   Diabetic Exams:  Diabetic Eye Exam: Completed . Pt has been advised about the importance in completing this exam.  Diabetic Foot Exam: Pt has been advised about the importance in completing this exam.  Interpreter Needed?: No  Information entered by :: Leroy Kennedy LPN   Activities of Daily Living    01/31/2022   11:19 AM  In your present state of health, do you have any difficulty performing the following activities:  Hearing? 0  Vision? 0  Difficulty concentrating or making decisions? 0  Walking or climbing  stairs? 0  Comment sometimes when her knee acts up  Dressing or bathing? 0  Doing errands, shopping? 0  Preparing Food and eating ? N  Using the Toilet? N  In the past six months, have you accidently leaked urine? Y  Do you have problems with loss of bowel control? N  Managing your Medications? N  Housekeeping or managing your Housekeeping? N    Patient Care Team: Michela Pitcher, NP as PCP - General (Pain Medicine)  Indicate any recent Medical Services you may have received from other than [facility] providers in the past year (date may be approximate).     Assessment:   This is a routine wellness examination for [facility]  Hearing/Vision screen Hearing Screening - Comments:: No trouble hearing Vision Screening - Comments:: Up to date [facility]  Dietary issues and exercise activities discussed: Current Exercise Habits: Home exercise routine, Type of exercise: walking, Time (Minutes): 30, Frequency (Times/Week): 4, Weekly Exercise (Minutes/Week): 120, Intensity: Mild   Goals Addressed             This Visit's Progress    Weight (lb) < 200 lb (90.7 kg)         Depression Screen    01/31/2022   11:11 AM 07/08/2021   12:38 PM  PHQ 2/9 Scores  PHQ - 2 Score 0 0  PHQ- 9 Score 0 7    Fall Risk     No data to display          FALL RISK PREVENTION PERTAINING TO THE HOME:  Any stairs in or around the home? Yes  If so, are there any without handrails? No  Home free of loose throw rugs in walkways, pet beds, electrical cords, etc? Yes  Adequate lighting in your home to reduce risk of falls? Yes   ASSISTIVE DEVICES UTILIZED TO PREVENT FALLS:  Life alert? No  Use of a  cane, walker or w/c? No  Grab bars in the bathroom? Yes  Shower chair or bench in shower? No  Elevated toilet seat or a handicapped toilet? Yes   TIMED UP AND GO:  Was the test performed? No .    Cognitive Function:        01/31/2022   11:08 AM  6CIT Screen  What Year? 0 points  What month? 0 points   What time? 0 points  Count back from 20 0 points  Months in reverse 4 points  Repeat phrase 0 points  Total Score 4 points    Immunizations Immunization History  Administered Date(s) Administered   Janssen (J&J) SARS-COV-2 Vaccination 10/09/2019, 06/01/2020   Pfizer Covid-19 Vaccine Bivalent Booster 45yr & up 06/17/2021   Pneumococcal Conjugate-13 05/06/2018   Pneumococcal Polysaccharide-23 05/03/2017   Pneumococcal-Unspecified 05/03/2017    TDAP status: Due, Education has been provided regarding the importance of this vaccine. Advised may receive this vaccine at local pharmacy or Health Dept. Aware to provide a copy of the vaccination record if obtained from local pharmacy or Health Dept. Verbalized acceptance and understanding.  Flu Vaccine status: Due, Education has been provided regarding the importance of this vaccine. Advised may receive this vaccine at local pharmacy or Health Dept. Aware to provide a copy of the vaccination record if obtained from local pharmacy or Health Dept. Verbalized acceptance and understanding.  Pneumococcal vaccine status: Up to date  Covid-19 vaccine status: Information provided on how to obtain vaccines.   Qualifies for Shingles Vaccine?     Per patient had out of state not sure where  Screening Tests Health Maintenance  Topic Date Due   FOOT EXAM  Never done   DEXA SCAN  Never done   INFLUENZA VACCINE  01/31/2022   COVID-19 Vaccine (4 - Additional dose for Janssen series) 02/16/2022 (Originally 10/16/2021)   Zoster Vaccines- Shingrix (1 of 2) 05/03/2022 (Originally 06/18/1995)   TETANUS/TDAP  02/01/2023 (Originally 06/17/1964)   OPHTHALMOLOGY EXAM  04/28/2022   HEMOGLOBIN A1C  05/21/2022   Pneumonia Vaccine 76 Years old  Completed   Hepatitis C Screening  Completed   HPV VACCINES  Aged Out    Health Maintenance  Health Maintenance Due  Topic Date Due   FOOT EXAM  Never done   DEXA SCAN  Never done   INFLUENZA VACCINE   01/31/2022    Colorectal cancer screening: No longer required.   Mammogram patient will call if she decides she would like to have one  Bone Density  Education provided will call if she would like to have this done declined at this time  Lung Cancer Screening: (Low Dose CT Chest recommended if Age 77-80years, 30 pack-year currently smoking OR have quit w/in 15years.) does not qualify.   Lung Cancer Screening Referral:   Additional Screening:  Hepatitis C Screening: does not qualify; Completed 2023  Vision Screening: Recommended annual ophthalmology exams for early detection of glaucoma and other disorders of the eye. Is the patient up to date with their annual eye exam?  Yes  Who is the provider or what is the name of the office in which the patient attends annual eye exams? [facility] pt is not established with a provider, would they like to be referred to a provider to establish care? No .   Dental Screening: Recommended annual dental exams for proper oral hygiene  Community Resource Referral / Chronic Care Management: CRR required this visit?  No   CCM required this  visit?  No      Plan:     I have personally reviewed and noted the following in the patient's chart:   Medical and social history Use of alcohol, tobacco or illicit drugs  Current medications and supplements including opioid prescriptions.  Functional ability and status Nutritional status Physical activity Advanced directives List of other physicians Hospitalizations, surgeries, and ER visits in previous 12 months Vitals Screenings to include cognitive, depression, and falls Referrals and appointments  In addition, I have reviewed and discussed with patient certain preventive protocols, quality metrics, and best practice recommendations. A written personalized care plan for preventive services as well as general preventive health recommendations were provided to patient.     Leroy Kennedy,  LPN   07/11/1658   Nurse Notes:

## 2022-01-31 NOTE — Patient Instructions (Signed)
-Deanna Ramirez  Thank you for taking time to come for your Medicare Wellness Visit. I appreciate your ongoing commitment to your health goals. Please review the following plan we discussed and let me know if I can assist you in the future.   Screening recommendations/referrals: Colonoscopy: no longer required Mammogram: Education provided Bone Density: Education provided Recommended yearly ophthalmology/optometry visit for glaucoma screening and checkup Recommended yearly dental visit for hygiene and checkup  Vaccinations: Influenza vaccine: Education provided Pneumococcal vaccine: up to date Tdap vaccine: Education provided Shingles vaccine: up to date    Advanced directives: copy requested  Conditions/risks identified:   Next appointment: 03-27-2022 @ 9:00 [place]   Preventive Care 32 Years and Older, Female Preventive care refers to lifestyle choices and visits with your health care provider that can promote health and wellness. What does preventive care include? A yearly physical exam. This is also called an annual well check. Dental exams once or twice a year. Routine eye exams. Ask your health care provider how often you should have your eyes checked. Personal lifestyle choices, including: Daily care of your teeth and gums. Regular physical activity. Eating a healthy diet. Avoiding tobacco and drug use. Limiting alcohol use. Practicing safe sex. Taking low-dose aspirin every day. Taking vitamin and mineral supplements as recommended by your health care provider. What happens during an annual well check? The services and screenings done by your health care provider during your annual well check will depend on your age, overall health, lifestyle risk factors, and family history of disease. Counseling  Your health care provider may ask you questions about your: Alcohol use. Tobacco use. Drug use. Emotional well-being. Home and relationship well-being. Sexual  activity. Eating habits. History of falls. Memory and ability to understand (cognition). Work and work Statistician. Reproductive health. Screening  You may have the following tests or measurements: Height, weight, and BMI. Blood pressure. Lipid and cholesterol levels. These may be checked every 5 years, or more frequently if you are over 88 years old. Skin check. Lung cancer screening. You may have this screening every year starting at age 45 if you have a 30-pack-year history of smoking and currently smoke or have quit within the past 15 years. Fecal occult blood test (FOBT) of the stool. You may have this test every year starting at age 65. Flexible sigmoidoscopy or colonoscopy. You may have a sigmoidoscopy every 5 years or a colonoscopy every 10 years starting at age 33. Hepatitis C blood test. Hepatitis B blood test. Sexually transmitted disease (STD) testing. Diabetes screening. This is done by checking your blood sugar (glucose) after you have not eaten for a while (fasting). You may have this done every 1-3 years. Bone density scan. This is done to screen for osteoporosis. You may have this done starting at age 16. Mammogram. This may be done every 1-2 years. Talk to your health care provider about how often you should have regular mammograms. Talk with your health care provider about your test results, treatment options, and if necessary, the need for more tests. Vaccines  Your health care provider may recommend certain vaccines, such as: Influenza vaccine. This is recommended every year. Tetanus, diphtheria, and acellular pertussis (Tdap, Td) vaccine. You may need a Td booster every 10 years. Zoster vaccine. You may need this after age 74. Pneumococcal 13-valent conjugate (PCV13) vaccine. One dose is recommended after age 20. Pneumococcal polysaccharide (PPSV23) vaccine. One dose is recommended after age 37. Talk to your health care provider about which screenings  and vaccines  you need and how often you need them. This information is not intended to replace advice given to you by your health care provider. Make sure you discuss any questions you have with your health care provider. Document Released: 07/16/2015 Document Revised: 03/08/2016 Document Reviewed: 04/20/2015 Elsevier Interactive Patient Education  2017 [employer] Prevention in the Home Falls can cause injuries. They can happen to people of all ages. There are many things you can do to make your home safe and to help prevent falls. What can I do on the outside of my home? Regularly fix the edges of walkways and driveways and fix any cracks. Remove anything that might make you trip as you walk through a door, such as a raised step or threshold. Trim any bushes or trees on the path to your home. Use bright outdoor lighting. Clear any walking paths of anything that might make someone trip, such as rocks or tools. Regularly check to see if handrails are loose or broken. Make sure that both sides of any steps have handrails. Any raised decks and porches should have guardrails on the edges. Have any leaves, snow, or ice cleared regularly. Use sand or salt on walking paths during winter. Clean up any spills in your garage right away. This includes oil or grease spills. What can I do in the bathroom? Use night lights. Install grab bars by the toilet and in the tub and shower. Do not use towel bars as grab bars. Use non-skid mats or decals in the tub or shower. If you need to sit down in the shower, use a plastic, non-slip stool. Keep the floor dry. Clean up any water that spills on the floor as soon as it happens. Remove soap buildup in the tub or shower regularly. Attach bath mats securely with double-sided non-slip rug tape. Do not have throw rugs and other things on the floor that can make you trip. What can I do in the bedroom? Use night lights. Make sure that you have a light by your bed that  is easy to reach. Do not use any sheets or blankets that are too big for your bed. They should not hang down onto the floor. Have a firm chair that has side arms. You can use this for support while you get dressed. Do not have throw rugs and other things on the floor that can make you trip. What can I do in the kitchen? Clean up any spills right away. Avoid walking on wet floors. Keep items that you use a lot in easy-to-reach places. If you need to reach something above you, use a strong step stool that has a grab bar. Keep electrical cords out of the way. Do not use floor polish or wax that makes floors slippery. If you must use wax, use non-skid floor wax. Do not have throw rugs and other things on the floor that can make you trip. What can I do with my stairs? Do not leave any items on the stairs. Make sure that there are handrails on both sides of the stairs and use them. Fix handrails that are broken or loose. Make sure that handrails are as long as the stairways. Check any carpeting to make sure that it is firmly attached to the stairs. Fix any carpet that is loose or worn. Avoid having throw rugs at the top or bottom of the stairs. If you do have throw rugs, attach them to the floor with carpet tape.  Make sure that you have a light switch at the top of the stairs and the bottom of the stairs. If you do not have them, ask someone to add them for you. What else can I do to help prevent falls? Wear shoes that: Do not have high heels. Have rubber bottoms. Are comfortable and fit you well. Are closed at the toe. Do not wear sandals. If you use a stepladder: Make sure that it is fully opened. Do not climb a closed stepladder. Make sure that both sides of the stepladder are locked into place. Ask someone to hold it for you, if possible. Clearly mark and make sure that you can see: Any grab bars or handrails. First and last steps. Where the edge of each step is. Use tools that help you  move around (mobility aids) if they are needed. These include: Canes. Walkers. Scooters. Crutches. Turn on the lights when you go into a dark area. Replace any light bulbs as soon as they burn out. Set up your furniture so you have a clear path. Avoid moving your furniture around. If any of your floors are uneven, fix them. If there are any pets around you, be aware of where they are. Review your medicines with your doctor. Some medicines can make you feel dizzy. This can increase your chance of falling. Ask your doctor what other things that you can do to help prevent falls. This information is not intended to replace advice given to you by your health care provider. Make sure you discuss any questions you have with your health care provider. Document Released: 04/15/2009 Document Revised: 11/25/2015 Document Reviewed: 07/24/2014 Elsevier Interactive Patient Education  2017 [employer].

## 2022-03-21 ENCOUNTER — Ambulatory Visit: Payer: Medicare HMO | Admitting: Nurse Practitioner

## 2022-03-27 ENCOUNTER — Ambulatory Visit (INDEPENDENT_AMBULATORY_CARE_PROVIDER_SITE_OTHER): Payer: Medicare HMO | Admitting: Nurse Practitioner

## 2022-03-27 VITALS — BP 125/76 | HR 62 | Temp 96.6°F | Resp 14 | Ht 67.0 in | Wt 170.1 lb

## 2022-03-27 DIAGNOSIS — E118 Type 2 diabetes mellitus with unspecified complications: Secondary | ICD-10-CM

## 2022-03-27 DIAGNOSIS — I10 Essential (primary) hypertension: Secondary | ICD-10-CM

## 2022-03-27 DIAGNOSIS — Z1231 Encounter for screening mammogram for malignant neoplasm of breast: Secondary | ICD-10-CM | POA: Diagnosis not present

## 2022-03-27 DIAGNOSIS — Z23 Encounter for immunization: Secondary | ICD-10-CM | POA: Diagnosis not present

## 2022-03-27 DIAGNOSIS — E039 Hypothyroidism, unspecified: Secondary | ICD-10-CM | POA: Diagnosis not present

## 2022-03-27 LAB — MICROALBUMIN / CREATININE URINE RATIO
Creatinine,U: 58.7 mg/dL
Microalb Creat Ratio: 4.2 mg/g (ref 0.0–30.0)
Microalb, Ur: 2.5 mg/dL — ABNORMAL HIGH (ref 0.0–1.9)

## 2022-03-27 LAB — POCT GLYCOSYLATED HEMOGLOBIN (HGB A1C): Hemoglobin A1C: 5.8 % — AB (ref 4.0–5.6)

## 2022-03-27 MED ORDER — LEVOTHYROXINE SODIUM 75 MCG PO TABS: 75.0000 ug | ORAL_TABLET | Freq: Every day | ORAL | 3 refills | Status: AC

## 2022-03-27 MED ORDER — AMLODIPINE BESYLATE 5 MG PO TABS: 5.0000 mg | ORAL_TABLET | Freq: Every day | ORAL | 3 refills | Status: AC

## 2022-03-27 NOTE — Progress Notes (Signed)
Established Patient Office Visit  Subjective   Patient ID: Deanna Ramirez, female    DOB: Nov 13, 1945  Age: 76 y.o. MRN: [id number]  Chief Complaint  Patient presents with   Diabetes    Follow up      DM: States that she did check her blood sugar yesterday morning. Sttes that she will check it approx 3 times a month. States she is on metformin 1,'000mg'$  twice a day   HTN: BP cuff at home and checks it a couple times a month. Tolerating her amlodipine and losartan well .  TSH: Doing well with the levothyroxine. Last TSH wnl.        Review of Systems  Constitutional:  Negative for chills and fever.  Respiratory:  Negative for shortness of breath.   Cardiovascular:  Negative for chest pain and leg swelling.  Neurological:  Negative for headaches.      Objective:     BP 125/76   Pulse 62   Temp (!) 96.6 F (35.9 C) (Temporal)   Resp 14   Ht '5\' 7"'$  (1.702 m)   Wt 170 lb 2 oz (77.2 kg)   BMI 26.65 kg/m  BP Readings from Last 3 Encounters:  03/27/22 125/76  11/18/21 134/72  10/10/21 118/82   Wt Readings from Last 3 Encounters:  03/27/22 170 lb 2 oz (77.2 kg)  11/18/21 164 lb 6 oz (74.6 kg)  10/10/21 163 lb (73.9 kg)      Physical Exam Vitals and nursing note reviewed.  Constitutional:      Appearance: Normal appearance.  Cardiovascular:     Rate and Rhythm: Normal rate and regular rhythm.     Heart sounds: Normal heart sounds.  Pulmonary:     Breath sounds: Normal breath sounds.  Musculoskeletal:     Right lower leg: No edema.     Left lower leg: No edema.  Lymphadenopathy:     Cervical: No cervical adenopathy.  Neurological:     Mental Status: She is alert.      Results for orders placed or performed in visit on 03/27/22  POCT glycosylated hemoglobin (Hb A1C)  Result Value Ref Range   Hemoglobin A1C 5.8 (A) 4.0 - 5.6 %   HbA1c POC (<> result, manual entry)     HbA1c, POC (prediabetic range)     HbA1c, POC (controlled diabetic range)         The ASCVD Risk score (Arnett DK, et al., 2019) failed to calculate for the following reasons:   Cannot find a previous HDL lab   Cannot find a previous total cholesterol lab    Assessment & Plan:   Problem List Items Addressed This Visit       Cardiovascular and Mediastinum   Primary hypertension    Patient blood pressure within normal limits.  Continue losartan and amlodipine as prescribed.  Continue taking blood pressure at home      Relevant Medications   amLODipine (NORVASC) 5 MG tablet     Endocrine   Hypothyroidism    Continue levothyroxine 75 mcg daily.      Relevant Medications   levothyroxine (SYNTHROID) 75 MCG tablet   Controlled type 2 diabetes mellitus with complication, without long-term current use of insulin (HCC) - Primary    A1c in office 5.8% today.  Patient currently maintained on metformin 1000 mg twice a day.  States she did have low glucose yesterday morning but was able to get it within normal limits eating a snack.  Did  discuss with her the higher likelihood of falls with hypoglycemia and her age.  If she continues having episodes of hypoglycemia we will titrate her medication down to 1000 mg daily of the metformin.  But currently we will keep it at the 1000 mg twice daily      Relevant Orders   POCT glycosylated hemoglobin (Hb A1C) (Completed)   Microalbumin/Creatinine Ratio, Urine   Other Visit Diagnoses     Need for immunization against influenza       Relevant Orders   Flu Vaccine QUAD High Dose(Fluad) (Completed)   Encounter for screening mammogram for malignant neoplasm of breast       Relevant Orders   MM Digital Screening       Return in about 6 months (around 09/25/2022) for CPE and labs.    Romilda Garret, NP

## 2022-03-27 NOTE — Assessment & Plan Note (Signed)
Patient blood pressure within normal limits.  Continue losartan and amlodipine as prescribed.  Continue taking blood pressure at home

## 2022-03-27 NOTE — Assessment & Plan Note (Signed)
-

## 2022-03-27 NOTE — Assessment & Plan Note (Signed)
A1c in office 5.8% today.  Patient currently maintained on metformin 1000 mg twice a day.  States Deanna Ramirez did have low glucose yesterday morning but was able to get it within normal limits eating a snack.  Did discuss with her the higher likelihood of falls with hypoglycemia and her age.  If Deanna Ramirez continues having episodes of hypoglycemia we will titrate her medication down to 1000 mg daily of the metformin.  But currently we will keep it at the 1000 mg twice daily

## 2022-03-27 NOTE — Patient Instructions (Signed)
Nice to see you today I want to see you in 6 months, sooner if you need me

## 2022-05-17 DIAGNOSIS — S62303A Unspecified fracture of third metacarpal bone, left hand, initial encounter for closed fracture: Secondary | ICD-10-CM | POA: Diagnosis not present

## 2022-05-17 DIAGNOSIS — Z7984 Long term (current) use of oral hypoglycemic drugs: Secondary | ICD-10-CM | POA: Diagnosis not present

## 2022-05-17 DIAGNOSIS — S098XXA Other specified injuries of head, initial encounter: Secondary | ICD-10-CM | POA: Diagnosis not present

## 2022-05-17 DIAGNOSIS — I2510 Atherosclerotic heart disease of native coronary artery without angina pectoris: Secondary | ICD-10-CM | POA: Diagnosis not present

## 2022-05-17 DIAGNOSIS — E785 Hyperlipidemia, unspecified: Secondary | ICD-10-CM | POA: Diagnosis not present

## 2022-05-17 DIAGNOSIS — I10 Essential (primary) hypertension: Secondary | ICD-10-CM | POA: Diagnosis not present

## 2022-05-17 DIAGNOSIS — S62308A Unspecified fracture of other metacarpal bone, initial encounter for closed fracture: Secondary | ICD-10-CM | POA: Diagnosis not present

## 2022-05-17 DIAGNOSIS — Z79890 Hormone replacement therapy (postmenopausal): Secondary | ICD-10-CM | POA: Diagnosis not present

## 2022-05-17 DIAGNOSIS — E039 Hypothyroidism, unspecified: Secondary | ICD-10-CM | POA: Diagnosis not present

## 2022-05-17 DIAGNOSIS — S79912A Unspecified injury of left hip, initial encounter: Secondary | ICD-10-CM | POA: Diagnosis not present

## 2022-05-17 DIAGNOSIS — S62353A Nondisplaced fracture of shaft of third metacarpal bone, left hand, initial encounter for closed fracture: Secondary | ICD-10-CM | POA: Diagnosis not present

## 2022-05-17 DIAGNOSIS — S0990XA Unspecified injury of head, initial encounter: Secondary | ICD-10-CM | POA: Diagnosis not present

## 2022-05-17 DIAGNOSIS — W1830XA Fall on same level, unspecified, initial encounter: Secondary | ICD-10-CM | POA: Diagnosis not present

## 2022-05-17 DIAGNOSIS — E119 Type 2 diabetes mellitus without complications: Secondary | ICD-10-CM | POA: Diagnosis not present

## 2022-05-23 DIAGNOSIS — M79642 Pain in left hand: Secondary | ICD-10-CM | POA: Diagnosis not present

## 2022-05-23 DIAGNOSIS — M79643 Pain in unspecified hand: Secondary | ICD-10-CM | POA: Diagnosis not present

## 2022-06-07 DIAGNOSIS — W19XXXD Unspecified fall, subsequent encounter: Secondary | ICD-10-CM | POA: Diagnosis not present

## 2022-06-07 DIAGNOSIS — S62303D Unspecified fracture of third metacarpal bone, left hand, subsequent encounter for fracture with routine healing: Secondary | ICD-10-CM | POA: Diagnosis not present

## 2022-06-21 DIAGNOSIS — M25561 Pain in right knee: Secondary | ICD-10-CM | POA: Diagnosis not present

## 2022-06-21 DIAGNOSIS — M069 Rheumatoid arthritis, unspecified: Secondary | ICD-10-CM | POA: Diagnosis not present

## 2022-07-06 DIAGNOSIS — M1711 Unilateral primary osteoarthritis, right knee: Secondary | ICD-10-CM | POA: Diagnosis not present

## 2022-07-24 ENCOUNTER — Telehealth: Payer: Self-pay | Admitting: Nurse Practitioner

## 2022-07-24 NOTE — Telephone Encounter (Signed)
Got notice that patient broke a bone and has not had a bone density scan. I think she has moved to [place], can we verify and see if she has appropriate follow up please

## 2022-07-25 NOTE — Telephone Encounter (Signed)
Spoke with patient and she has moved to [place]. She has appointment with new PCP on 07/27/22.   PCP: Jackelyn Knife, NP Primary location:  [address], [place] [address]  [phone]

## 2022-07-27 DIAGNOSIS — K2270 Barrett's esophagus without dysplasia: Secondary | ICD-10-CM | POA: Diagnosis not present

## 2022-07-27 DIAGNOSIS — M797 Fibromyalgia: Secondary | ICD-10-CM | POA: Diagnosis not present

## 2022-07-27 DIAGNOSIS — I10 Essential (primary) hypertension: Secondary | ICD-10-CM | POA: Diagnosis not present

## 2022-07-27 DIAGNOSIS — M25469 Effusion, unspecified knee: Secondary | ICD-10-CM | POA: Diagnosis not present

## 2022-07-27 DIAGNOSIS — M069 Rheumatoid arthritis, unspecified: Secondary | ICD-10-CM | POA: Diagnosis not present

## 2022-07-27 DIAGNOSIS — Z789 Other specified health status: Secondary | ICD-10-CM | POA: Diagnosis not present

## 2022-07-27 DIAGNOSIS — R32 Unspecified urinary incontinence: Secondary | ICD-10-CM | POA: Diagnosis not present

## 2022-07-27 DIAGNOSIS — Z780 Asymptomatic menopausal state: Secondary | ICD-10-CM | POA: Diagnosis not present

## 2022-07-27 DIAGNOSIS — H409 Unspecified glaucoma: Secondary | ICD-10-CM | POA: Diagnosis not present

## 2022-08-23 DIAGNOSIS — H401130 Primary open-angle glaucoma, bilateral, stage unspecified: Secondary | ICD-10-CM | POA: Diagnosis not present

## 2022-08-23 DIAGNOSIS — H5203 Hypermetropia, bilateral: Secondary | ICD-10-CM | POA: Diagnosis not present

## 2022-08-23 DIAGNOSIS — E119 Type 2 diabetes mellitus without complications: Secondary | ICD-10-CM | POA: Diagnosis not present

## 2022-08-23 DIAGNOSIS — Z961 Presence of intraocular lens: Secondary | ICD-10-CM | POA: Diagnosis not present

## 2022-08-23 DIAGNOSIS — H52203 Unspecified astigmatism, bilateral: Secondary | ICD-10-CM | POA: Diagnosis not present

## 2022-08-25 DIAGNOSIS — Z780 Asymptomatic menopausal state: Secondary | ICD-10-CM | POA: Diagnosis not present

## 2022-08-25 DIAGNOSIS — M75102 Unspecified rotator cuff tear or rupture of left shoulder, not specified as traumatic: Secondary | ICD-10-CM | POA: Diagnosis not present

## 2022-08-25 DIAGNOSIS — Z96612 Presence of left artificial shoulder joint: Secondary | ICD-10-CM | POA: Diagnosis not present

## 2022-08-25 DIAGNOSIS — M8589 Other specified disorders of bone density and structure, multiple sites: Secondary | ICD-10-CM | POA: Diagnosis not present

## 2022-08-25 DIAGNOSIS — M19012 Primary osteoarthritis, left shoulder: Secondary | ICD-10-CM | POA: Diagnosis not present

## 2022-08-25 DIAGNOSIS — Z8489 Family history of other specified conditions: Secondary | ICD-10-CM | POA: Diagnosis not present

## 2022-09-06 DIAGNOSIS — M19111 Post-traumatic osteoarthritis, right shoulder: Secondary | ICD-10-CM | POA: Diagnosis not present

## 2022-09-25 ENCOUNTER — Encounter: Payer: Self-pay | Admitting: Nurse Practitioner

## 2022-09-25 ENCOUNTER — Encounter: Payer: Medicare HMO | Admitting: Nurse Practitioner

## 2022-10-20 DIAGNOSIS — H401131 Primary open-angle glaucoma, bilateral, mild stage: Secondary | ICD-10-CM | POA: Diagnosis not present

## 2022-10-20 DIAGNOSIS — H401130 Primary open-angle glaucoma, bilateral, stage unspecified: Secondary | ICD-10-CM | POA: Diagnosis not present

## 2022-10-28 DIAGNOSIS — H524 Presbyopia: Secondary | ICD-10-CM | POA: Diagnosis not present

## 2022-11-25 ENCOUNTER — Other Ambulatory Visit: Payer: Self-pay | Admitting: Nurse Practitioner

## 2023-01-14 ENCOUNTER — Other Ambulatory Visit: Payer: Self-pay | Admitting: Nurse Practitioner

## 2023-01-14 DIAGNOSIS — E039 Hypothyroidism, unspecified: Secondary | ICD-10-CM

## 2023-01-19 DIAGNOSIS — L570 Actinic keratosis: Secondary | ICD-10-CM | POA: Diagnosis not present

## 2023-01-19 DIAGNOSIS — R5382 Chronic fatigue, unspecified: Secondary | ICD-10-CM | POA: Diagnosis not present

## 2023-01-19 DIAGNOSIS — M810 Age-related osteoporosis without current pathological fracture: Secondary | ICD-10-CM | POA: Diagnosis not present

## 2023-01-19 DIAGNOSIS — E034 Atrophy of thyroid (acquired): Secondary | ICD-10-CM | POA: Diagnosis not present

## 2023-01-19 DIAGNOSIS — D649 Anemia, unspecified: Secondary | ICD-10-CM | POA: Diagnosis not present

## 2023-01-19 DIAGNOSIS — K2270 Barrett's esophagus without dysplasia: Secondary | ICD-10-CM | POA: Diagnosis not present

## 2023-01-19 DIAGNOSIS — I10 Essential (primary) hypertension: Secondary | ICD-10-CM | POA: Diagnosis not present

## 2023-02-11 ENCOUNTER — Other Ambulatory Visit: Payer: Self-pay | Admitting: Nurse Practitioner

## 2023-02-11 DIAGNOSIS — I10 Essential (primary) hypertension: Secondary | ICD-10-CM

## 2023-06-12 IMAGING — DX DG KNEE COMPLETE 4+V*R*
4 series · 4 of 4 positions shown · non-contrast
Comparison: None.

CLINICAL DATA: pain

EXAM:
RIGHT KNEE - COMPLETE 4+ VIEW

[knee ap]
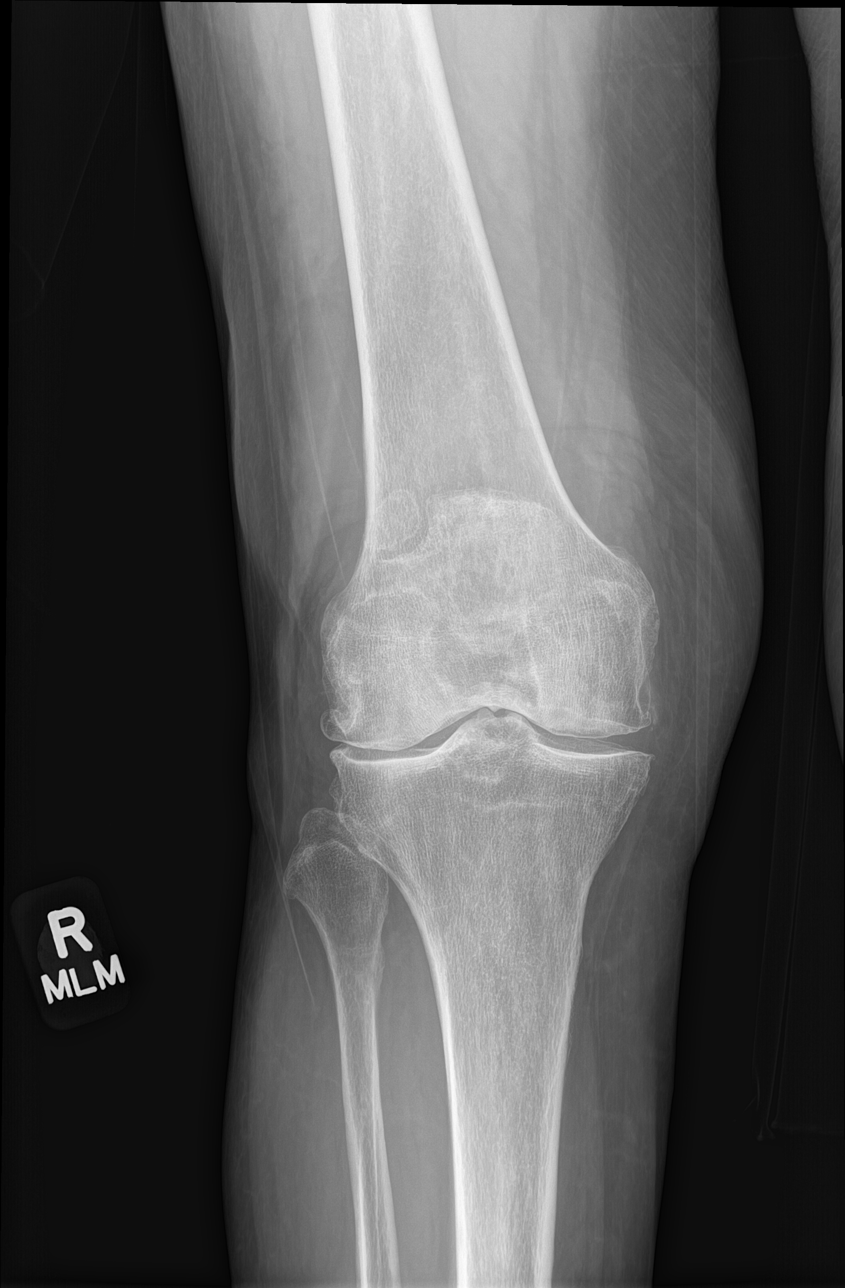

[knee lat]
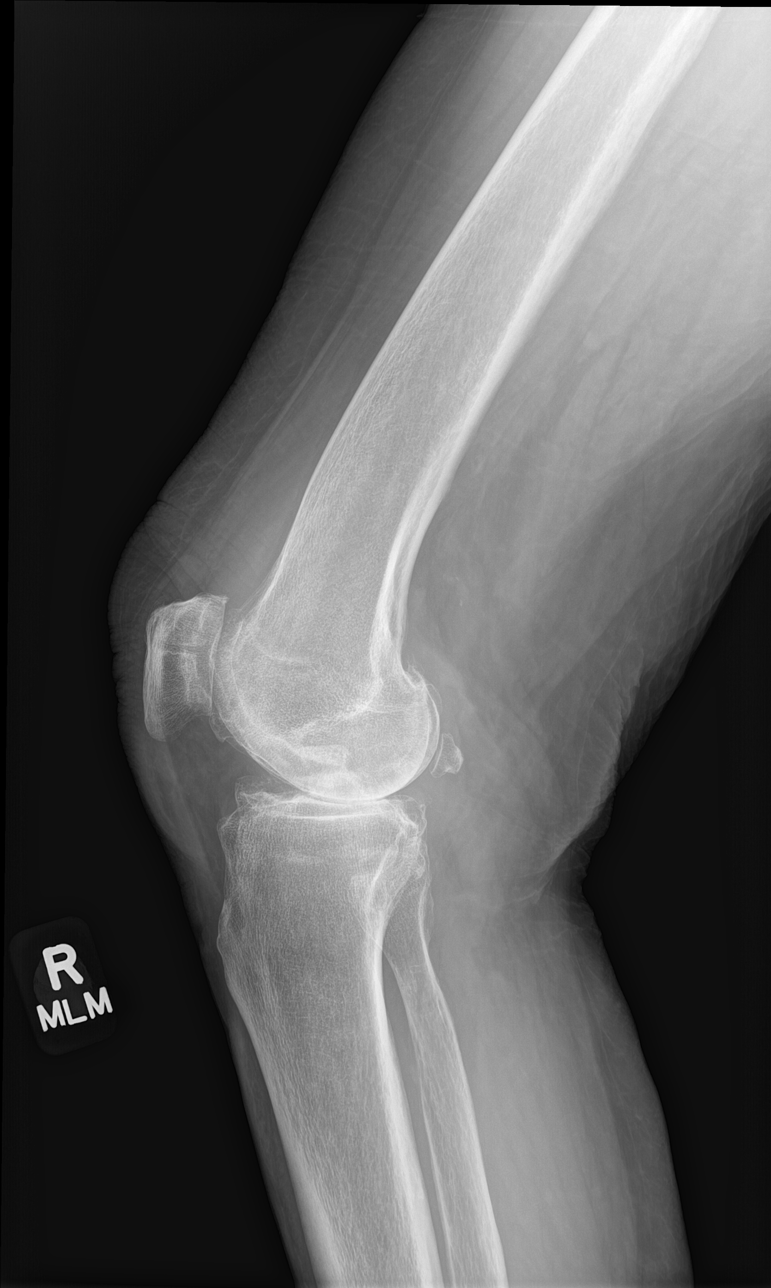

[patella skyline]
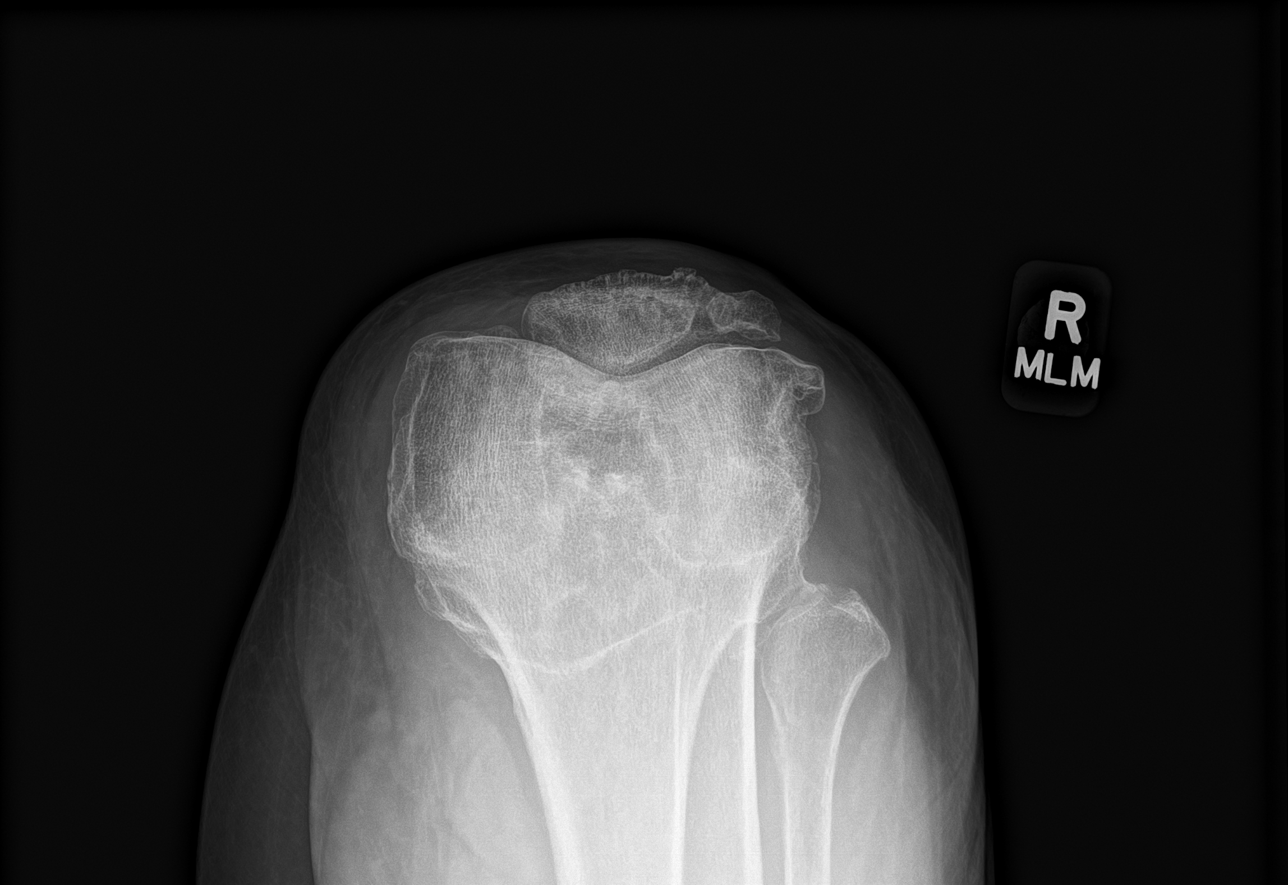

[knee tunnel]
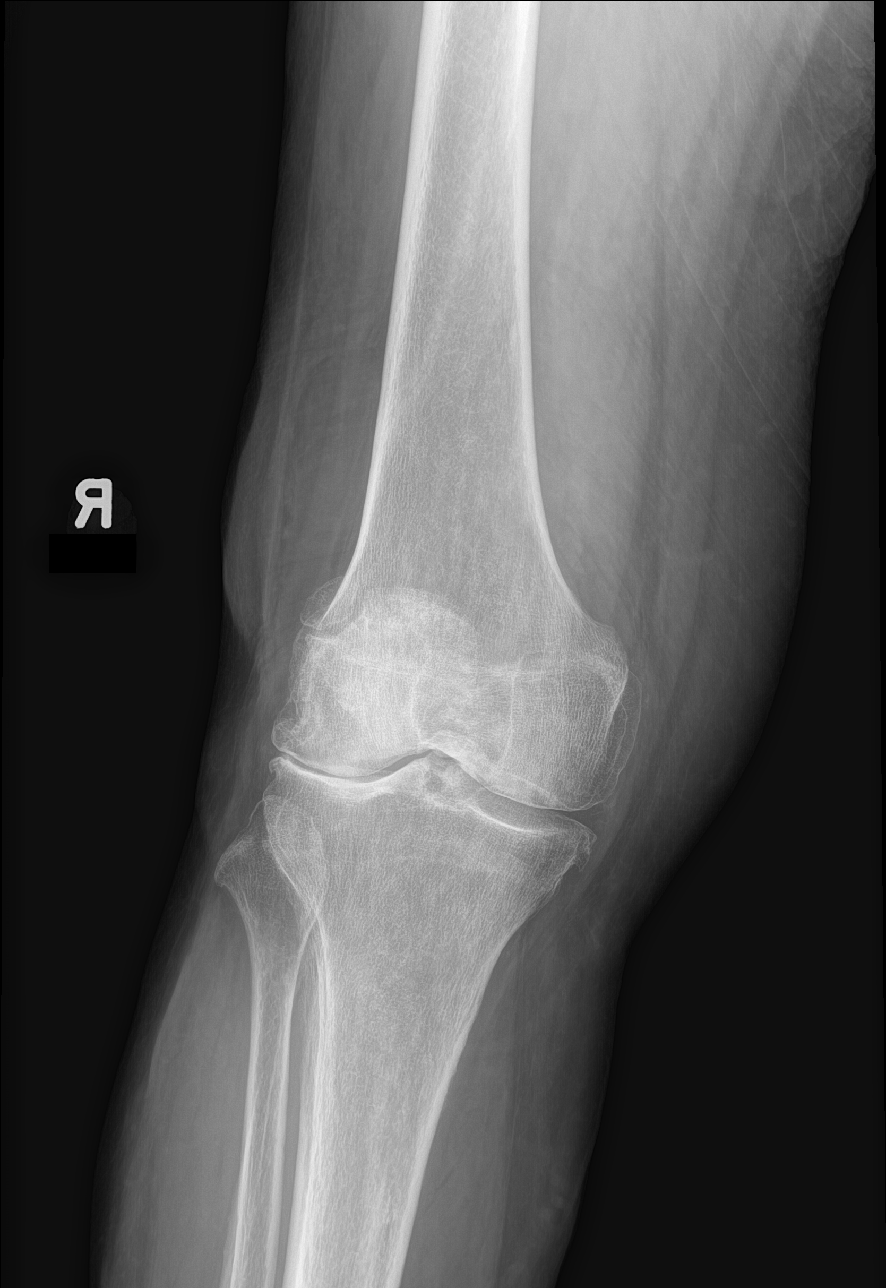

[4 of 4 positions shown; findings below may reference images not displayed]

FINDINGS: No evidence of fracture, dislocation, or joint effusion. Bipartite
patella versus less likely old healed vertical fracture through the
patella. At least moderate tricompartmental degenerative changes of
the knee. No aggressive focal bone abnormality. Soft tissues are
unremarkable.
IMPRESSION: At least moderate tricompartmental degenerative changes of the knee.

No acute displaced fracture or dislocation.

## 2023-08-01 IMAGING — DX DG ABDOMEN 2V
3 series · 3 of 3 positions shown · non-contrast
Comparison: None.

CLINICAL DATA: Diarrhea.

EXAM:
ABDOMEN - 2 VIEW

[abdomen erect]
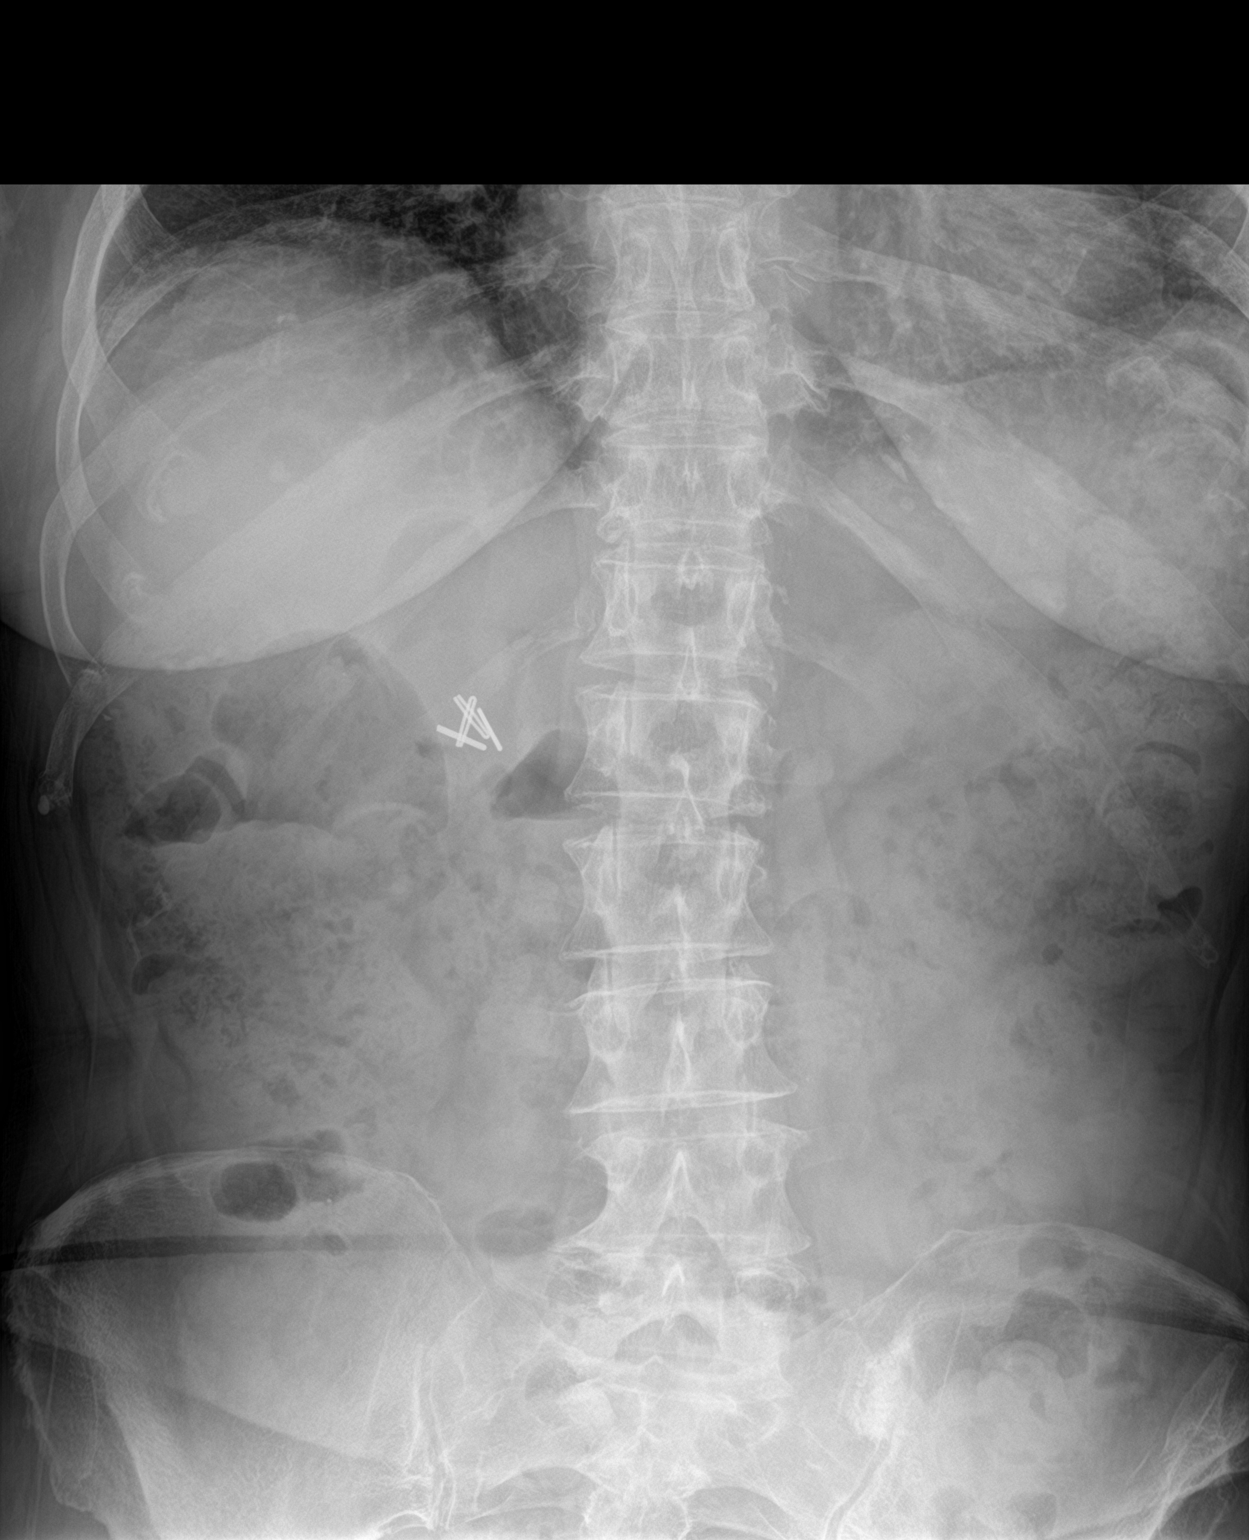

[abdomen supine (1 of 2)]
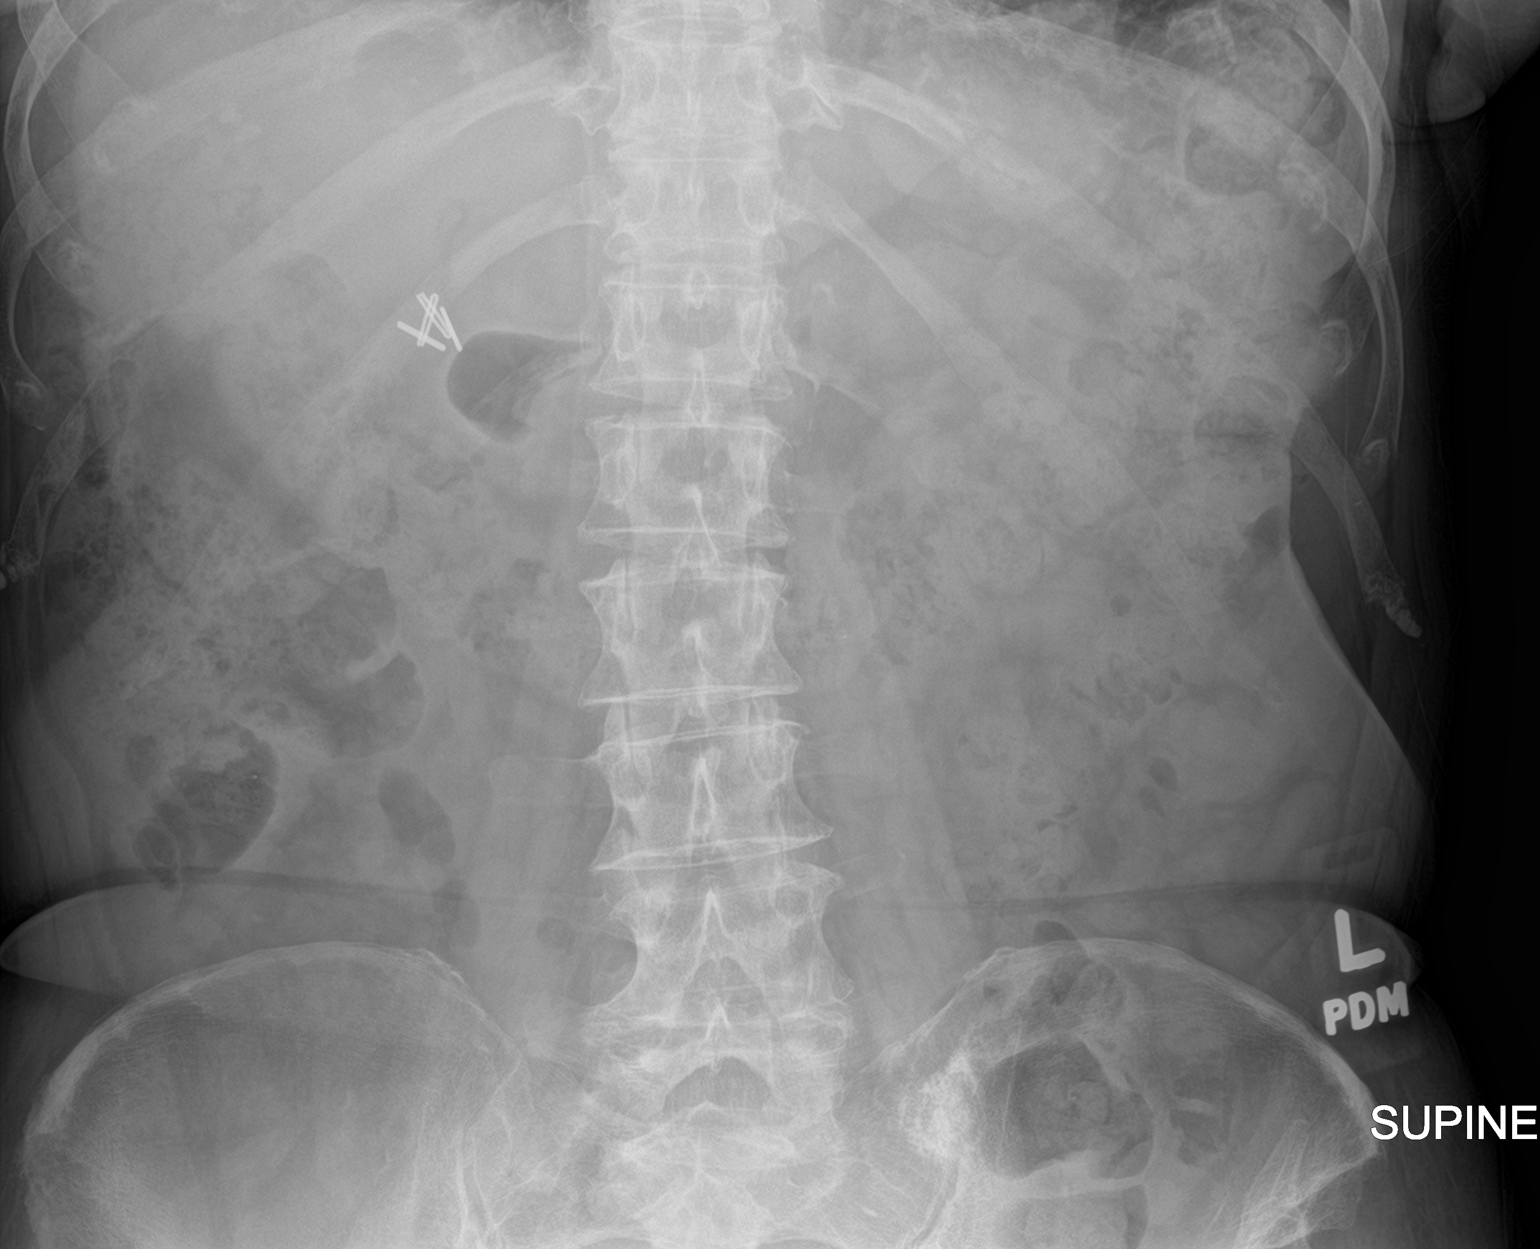

[abdomen supine (2 of 2)]
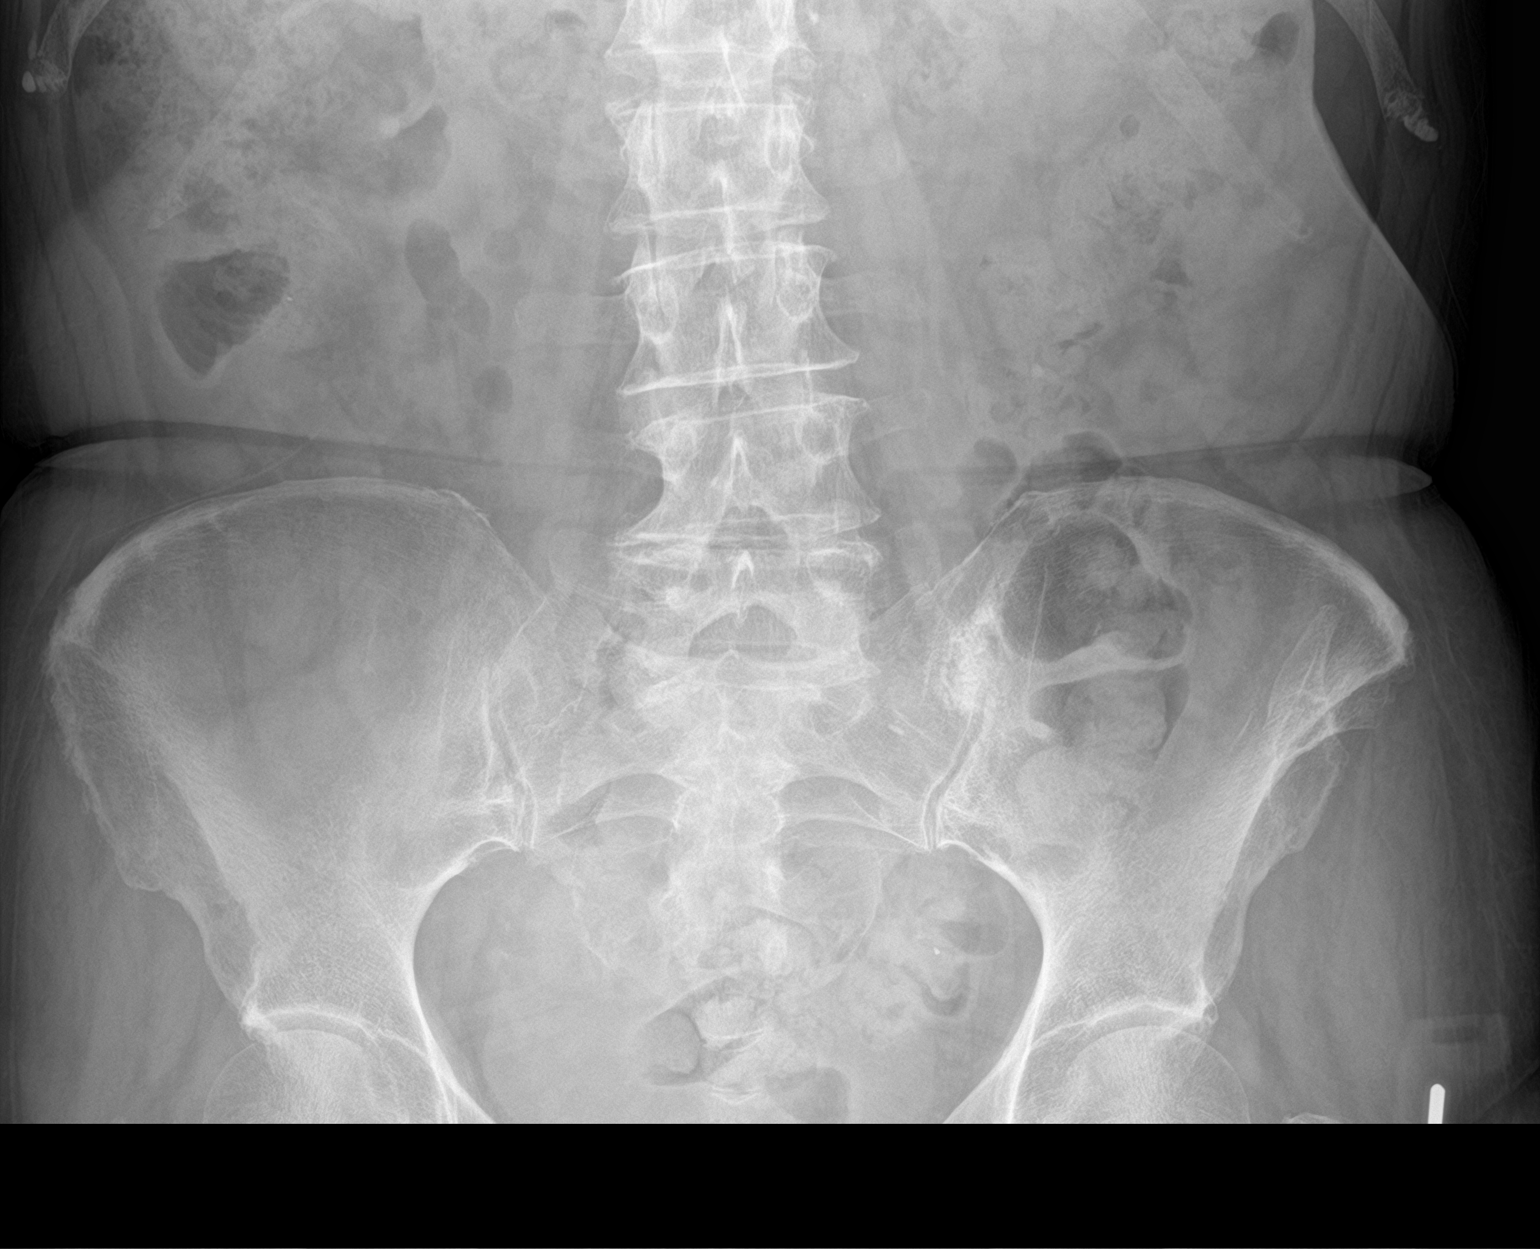

[3 of 3 positions shown; findings below may reference images not displayed]

FINDINGS: There is moderate amount of stool throughout the colon. No bowel
dilatation or evidence of obstruction. No free air or radiopaque
calculi. Right upper quadrant cholecystectomy clips. Degenerative
changes of the spine. No acute osseous pathology.
IMPRESSION: Constipation. No bowel obstruction.
